# Patient Record
Sex: Male | Born: 1985
Health system: Southern US, Community
[De-identification: ages and names within clinical notes are randomized; demographics above are authoritative.]

## PROBLEM LIST (undated history)

## (undated) DIAGNOSIS — R569 Unspecified convulsions: Secondary | ICD-10-CM

## (undated) DIAGNOSIS — J40 Bronchitis, not specified as acute or chronic: Secondary | ICD-10-CM

## (undated) DIAGNOSIS — K200 Eosinophilic esophagitis: Secondary | ICD-10-CM

## (undated) DIAGNOSIS — G8929 Other chronic pain: Secondary | ICD-10-CM

## (undated) DIAGNOSIS — R0981 Nasal congestion: Secondary | ICD-10-CM

## (undated) DIAGNOSIS — T7840XA Allergy, unspecified, initial encounter: Secondary | ICD-10-CM

## (undated) DIAGNOSIS — M94261 Chondromalacia, right knee: Secondary | ICD-10-CM

## (undated) DIAGNOSIS — F419 Anxiety disorder, unspecified: Secondary | ICD-10-CM

## (undated) DIAGNOSIS — M545 Low back pain, unspecified: Secondary | ICD-10-CM

## (undated) DIAGNOSIS — I639 Cerebral infarction, unspecified: Secondary | ICD-10-CM

## (undated) DIAGNOSIS — J189 Pneumonia, unspecified organism: Secondary | ICD-10-CM

## (undated) DIAGNOSIS — F32A Depression, unspecified: Secondary | ICD-10-CM

## (undated) DIAGNOSIS — K602 Anal fissure, unspecified: Secondary | ICD-10-CM

## (undated) DIAGNOSIS — R12 Heartburn: Secondary | ICD-10-CM

## (undated) DIAGNOSIS — T304 Corrosion of unspecified body region, unspecified degree: Secondary | ICD-10-CM

## (undated) DIAGNOSIS — Z87898 Personal history of other specified conditions: Secondary | ICD-10-CM

## (undated) DIAGNOSIS — K589 Irritable bowel syndrome without diarrhea: Secondary | ICD-10-CM

## (undated) HISTORY — DX: Pneumonia, unspecified organism: J18.9

## (undated) HISTORY — DX: Allergy, unspecified, initial encounter: T78.40XA

## (undated) HISTORY — DX: Unspecified convulsions: R56.9

## (undated) HISTORY — DX: Eosinophilic esophagitis: K20.0

## (undated) HISTORY — PX: COLONOSCOPY: SHX174

## (undated) HISTORY — DX: Depression, unspecified: F32.A

## (undated) HISTORY — DX: Cerebral infarction, unspecified: I63.9

## (undated) HISTORY — PX: UPPER GASTROINTESTINAL ENDOSCOPY: SHX188

## (undated) HISTORY — DX: Anxiety disorder, unspecified: F41.9

## (undated) HISTORY — DX: Anal fissure, unspecified: K60.2

## (undated) HISTORY — DX: Corrosion of unspecified body region, unspecified degree: T30.4

---
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
## 2009-09-16 ENCOUNTER — Ambulatory Visit: Payer: Self-pay | Admitting: Diagnostic Radiology

## 2009-09-16 ENCOUNTER — Emergency Department (HOSPITAL_BASED_OUTPATIENT_CLINIC_OR_DEPARTMENT_OTHER): Admission: EM | Admit: 2009-09-16 | Discharge: 2009-09-17 | Payer: Self-pay | Admitting: Emergency Medicine

## 2010-02-01 ENCOUNTER — Ambulatory Visit
Admission: RE | Admit: 2010-02-01 | Discharge: 2010-02-01 | Payer: Self-pay | Source: Home / Self Care | Attending: Internal Medicine | Admitting: Internal Medicine

## 2010-02-01 ENCOUNTER — Other Ambulatory Visit: Payer: Self-pay | Admitting: Internal Medicine

## 2010-02-01 DIAGNOSIS — R030 Elevated blood-pressure reading, without diagnosis of hypertension: Secondary | ICD-10-CM | POA: Insufficient documentation

## 2010-02-01 DIAGNOSIS — K209 Esophagitis, unspecified without bleeding: Secondary | ICD-10-CM | POA: Insufficient documentation

## 2010-02-01 DIAGNOSIS — K922 Gastrointestinal hemorrhage, unspecified: Secondary | ICD-10-CM | POA: Insufficient documentation

## 2010-02-01 LAB — CBC WITH DIFFERENTIAL/PLATELET
Basophils Absolute: 0.1 10*3/uL (ref 0.0–0.1)
Basophils Relative: 1.4 % (ref 0.0–3.0)
Eosinophils Absolute: 0.2 10*3/uL (ref 0.0–0.7)
Eosinophils Relative: 3.4 % (ref 0.0–5.0)
HCT: 43.2 % (ref 39.0–52.0)
Hemoglobin: 14.9 g/dL (ref 13.0–17.0)
Lymphocytes Relative: 29.3 % (ref 12.0–46.0)
Lymphs Abs: 1.5 10*3/uL (ref 0.7–4.0)
MCHC: 34.5 g/dL (ref 30.0–36.0)
MCV: 89.6 fl (ref 78.0–100.0)
Monocytes Absolute: 0.4 10*3/uL (ref 0.1–1.0)
Monocytes Relative: 7.1 % (ref 3.0–12.0)
Neutro Abs: 3 10*3/uL (ref 1.4–7.7)
Neutrophils Relative %: 58.8 % (ref 43.0–77.0)
Platelets: 230 10*3/uL (ref 150.0–400.0)
RBC: 4.82 Mil/uL (ref 4.22–5.81)
RDW: 13 % (ref 11.5–14.6)
WBC: 5.1 10*3/uL (ref 4.5–10.5)

## 2010-02-01 LAB — HEPATIC FUNCTION PANEL
ALT: 29 U/L (ref 0–53)
AST: 24 U/L (ref 0–37)
Albumin: 4.5 g/dL (ref 3.5–5.2)
Alkaline Phosphatase: 59 U/L (ref 39–117)
Bilirubin, Direct: 0.2 mg/dL (ref 0.0–0.3)
Total Bilirubin: 1.2 mg/dL (ref 0.3–1.2)
Total Protein: 7.4 g/dL (ref 6.0–8.3)

## 2010-02-01 LAB — BASIC METABOLIC PANEL
BUN: 12 mg/dL (ref 6–23)
CO2: 30 mEq/L (ref 19–32)
Calcium: 9.7 mg/dL (ref 8.4–10.5)
Chloride: 103 mEq/L (ref 96–112)
Creatinine, Ser: 0.8 mg/dL (ref 0.4–1.5)
GFR: 137.75 mL/min (ref >60.00)
Glucose, Bld: 75 mg/dL (ref 70–99)
Potassium: 4.2 mEq/L (ref 3.5–5.1)
Sodium: 141 mEq/L (ref 135–145)

## 2010-02-01 LAB — IBC PANEL
Iron: 186 ug/dL — ABNORMAL HIGH (ref 42–165)
Saturation Ratios: 53.8 % — ABNORMAL HIGH (ref 20.0–50.0)
Transferrin: 247 mg/dL (ref 212.0–360.0)

## 2010-02-01 LAB — CHOLESTEROL, TOTAL: Cholesterol: 147 mg/dL (ref 0–200)

## 2010-02-01 LAB — TSH: TSH: 0.98 u[IU]/mL (ref 0.35–5.50)

## 2010-02-04 ENCOUNTER — Emergency Department (HOSPITAL_COMMUNITY)
Admission: EM | Admit: 2010-02-04 | Discharge: 2010-02-05 | Payer: Self-pay | Source: Home / Self Care | Admitting: Emergency Medicine

## 2010-02-06 ENCOUNTER — Telehealth: Payer: Self-pay | Admitting: Internal Medicine

## 2010-02-07 ENCOUNTER — Encounter: Payer: Self-pay | Admitting: Internal Medicine

## 2010-02-10 ENCOUNTER — Encounter: Payer: Self-pay | Admitting: Internal Medicine

## 2010-02-13 LAB — URINALYSIS, ROUTINE W REFLEX MICROSCOPIC
Bilirubin Urine: NEGATIVE
Hgb urine dipstick: NEGATIVE
Ketones, ur: NEGATIVE mg/dL
Nitrite: NEGATIVE
Protein, ur: NEGATIVE mg/dL
Specific Gravity, Urine: 1.01 (ref 1.005–1.030)
Urine Glucose, Fasting: NEGATIVE mg/dL
Urobilinogen, UA: 0.2 mg/dL (ref 0.0–1.0)
pH: 6 (ref 5.0–8.0)

## 2010-02-13 LAB — DIFFERENTIAL
Basophils Absolute: 0.1 10*3/uL (ref 0.0–0.1)
Basophils Relative: 1 % (ref 0–1)
Eosinophils Absolute: 0.2 10*3/uL (ref 0.0–0.7)
Eosinophils Relative: 2 % (ref 0–5)
Lymphocytes Relative: 15 % (ref 12–46)
Lymphs Abs: 1.5 10*3/uL (ref 0.7–4.0)
Monocytes Absolute: 0.6 10*3/uL (ref 0.1–1.0)
Monocytes Relative: 6 % (ref 3–12)
Neutro Abs: 7.8 10*3/uL — ABNORMAL HIGH (ref 1.7–7.7)
Neutrophils Relative %: 77 % (ref 43–77)

## 2010-02-13 LAB — POCT I-STAT, CHEM 8
BUN: 19 mg/dL (ref 6–23)
Calcium, Ion: 1.16 mmol/L (ref 1.12–1.32)
Chloride: 104 mEq/L (ref 96–112)
Creatinine, Ser: 1 mg/dL (ref 0.4–1.5)
Glucose, Bld: 98 mg/dL (ref 70–99)
HCT: 46 % (ref 39.0–52.0)
Hemoglobin: 15.6 g/dL (ref 13.0–17.0)
Potassium: 3.7 mEq/L (ref 3.5–5.1)
Sodium: 141 mEq/L (ref 135–145)
TCO2: 27 mmol/L (ref 0–100)

## 2010-02-13 LAB — CBC
HCT: 43.3 % (ref 39.0–52.0)
Hemoglobin: 15.2 g/dL (ref 13.0–17.0)
MCH: 30.6 pg (ref 26.0–34.0)
MCHC: 35.1 g/dL (ref 30.0–36.0)
MCV: 87.3 fL (ref 78.0–100.0)
Platelets: 218 10*3/uL (ref 150–400)
RBC: 4.96 MIL/uL (ref 4.22–5.81)
RDW: 12.4 % (ref 11.5–15.5)
WBC: 10.1 10*3/uL (ref 4.0–10.5)

## 2010-02-23 ENCOUNTER — Encounter: Payer: Self-pay | Admitting: Internal Medicine

## 2010-03-02 ENCOUNTER — Encounter: Payer: Self-pay | Admitting: Internal Medicine

## 2010-03-02 NOTE — Consult Note (Signed)
Summary: [facility]  [facility]   Imported By: Lanelle Bal 02/17/2010 08:37:25  _____________________________________________________________________  External Attachment:    Type:   Image     Comment:   External Document

## 2010-03-02 NOTE — Progress Notes (Signed)
Summary: [facility]  Phone Note Outgoing Call Call back at [phone]   Details for Reason: [facility] Triage Call Report Triage Record Num: [id number] Operator: Caswell Corwin Patient Name: Benjamin Reed Call Date & Time: 02/04/2010 4:01:12PM Patient Phone: [phone] PCP: Patient Gender: Male PCP Fax : Patient DOB: [birth year] Practice Name: [facility] Reason for Call: Pt states he was seen 02/01/10 for a physical and has internal hemorroids. has an appt set with gastroenterologist. Today can barely walk due to stomach and hemorrhoid pain. Hemorroids are protruding out and pain is making him nauseated. Abd pain started aboit 1300 and is constant, buring and stabbing. Has vomitined 2-3 x. Afebrile. Feels like his " heart is going to pound out of his chest for the past hr." Triaged rectal sx, abd pain and GI bldg. Pt having unbearable pain and feels dizzy even with laying down. Inst to go to E/R by 911. Protocol(s) Used: Abdominal Pain Protocol(s) Used: Gastrointestinal Bleeding Protocol(s) Used: Rectal Symptoms Recommended Outcome per Protocol: Activate EMS 911 Reason for Outcome: Associated with abdominal pain or discomfort GI bleeding, more than streaks or scant amount of blood or passing black tarry stool(s) Passing red, black or tarry material from rectum AND onset of new signs and symptoms of hypovolemia Care Advice:  ~ Do not give the patient anything to eat or drink.  ~ An adult should stay with the patient, preferably one trained in CPR. Lay the person down and elevate legs at least 12 inches (30 cm) above level of heart. Cover to help maintain body temperature.  ~ Write down provider's name. List or place the following in a bag for transport with the patient: current prescription and/or nonprescription medications; alternative treatments, therapies and medications; and street drugs.  ~ 01/ Summary of Call: I called to check on the patient and was made  aware that he is not there right now. Lm with man who answered to have the patient call our office. Lucious Groves CMA  February 06, 2010 9:07 AM   I spoke with the patient and he notes that he is still pain-- LLQ of the abd. Bright and dark red bleeding began yesterday. He notes that he will see GI tomorrow morning. Please advise. Initial call taken by: Lucious Groves CMA,  February 06, 2010 9:19 AM  Follow-up for Phone Call        keep GI appointment If fever, severe abdominal pain, feeling fainty or severe bleeding ----> go to the ER Follow-up by: [facility] E. Paz MD,  February 06, 2010 9:34 AM  Additional Follow-up for Phone Call Additional follow up Details #1::        Patient notified. Additional Follow-up by: Lucious Groves CMA,  February 06, 2010 10:07 AM

## 2010-03-02 NOTE — Procedures (Signed)
Summary: Colonoscopy -- neg except for hemorrhoids  Colonoscopy Report/[facility]   Imported By: Maryln Gottron 02/22/2010 15:47:16  _____________________________________________________________________  External Attachment:    Type:   Image     Comment:   External Document

## 2010-03-02 NOTE — Assessment & Plan Note (Signed)
Summary: NEW TO EST//PH   Vital Signs:  Patient profile:   25 year old male Height:      70 inches (177.80 cm) Weight:      139 pounds (63.18 kg) BMI:     20.02 Temp:     98.4 degrees F (36.89 degrees C) oral Pulse rate:   70 / minute BP sitting:   140 / 50  (left arm)  Vitals Entered By: Lucious Groves CMA (February 01, 2010 1:03 PM) CC: NP est care/CPX./kb Is Patient Diabetic? No Pain Assessment Patient in pain? no      Comments Patient notes that he would like referral due to history of trouble with hemorrhoids.   History of Present Illness: CPX here w/ mom     Preventive Screening-Counseling & Management  Alcohol-Tobacco     Smoking Status: current      Drug Use:  no.    Current Medications (verified): 1)  None  Allergies (verified): No Known Drug Allergies  Past History:  Past Medical History: Febrile SZ a s a child blood in stools: Cscope 2009---> internal hemorrhoids  eosinophilic esophagitis per patient  (EGD 9-09) elevated BP? h/o anxiety when he was in the [employer], was Rx zoloft   Past Surgical History: no major   Family History: Cholesterol-- M, GPs HTN-- M , GPs MI-- no colon ca-- GGM dx in her 31 to 79s  colon polyps--  GF dx in his late 25s , aunt  prostate ca-- PGF   Social History: was in the [employer] Occupation: International aid/development worker  Married, lives in his parents house  wife pregnant  Current Smoker-- 4 cigs per day since age 40 and uses dip, once can every 3-4 days Alcohol use-yes--once a week Drug use-no Occupation:  employed Smoking Status:  current Drug Use:  no  Review of Systems General:  Denies fatigue and fever; wst varies . CV:  Denies swelling of feet; occasionally CP. Resp:  Denies cough. GI:  occasionally nausea, occasionally diarrhea  + abdominal pain, L>R x years (2009), worse x 7 months  has almost daily red blood per rectum, sometimes abundant. This is going on for years. He has occasional odynophagia, frequent  dysphagia.. GU:  Denies dysuria and hematuria. Psych:  Denies anxiety and depression.  Physical Exam  General:  alert, well-developed, and well-nourished.   Ears:  no pale or jaundice Neck:  no masses and no thyromegaly.   Lungs:  normal respiratory effort, no intercostal retractions, no accessory muscle use, and normal breath sounds.   Heart:  normal rate, regular rhythm, no gallop, and no rub.  very mild systolic murmur?  Abdomen:  soft, normal bowel sounds, no distention, no masses, no guarding, and no rigidity.  mild diffuse tenderness. Rectal:  No external abnormalities noted. Normal sphincter tone. No rectal masses or tenderness on DRE ANOSCOPY: Multiple small internal hemorrhoids with no signs of active or recent bleed Prostate:  Prostate gland firm and smooth, no enlargement, nodularity, tenderness, mass, asymmetry or induration.   Impression & Recommendations:  Problem # 1:  ROUTINE GENERAL MEDICAL EXAM@HEALTH  CARE FACL (ICD-V70.0) Td-- 2008 per patient  recommend a flu shot if so desired We discussed diet and exercise STE info provided  Labs  Orders: Venipuncture (04540) TLB-Cholesterol, Total (82465-CHO) TLB-BMP (Basic Metabolic Panel-BMET) (80048-METABOL) TLB-CBC Platelet - w/Differential (85025-CBCD) TLB-TSH (Thyroid Stimulating Hormone) (84443-TSH) TLB-Hepatic/Liver Function Pnl (80076-HEPATIC) Specimen Handling (98119)  Problem # 2:  GI BLEEDING (ICD-578.9) persistent red blood per rectum. Apparently he had  a colonoscopy in 2009 while  he was in the [employer] and he was told he has internal hemorrhoids. He also has intermittent diarrhea and ill-defined abdominal pain   plan: Refer to GI, colonoscopy?. bleeding may be from hemorrhoids but given the other symptoms, other etiologies are possible  Orders: TLB-IBC Pnl (Iron/FE;Transferrin) (83550-IBC) Specimen Handling (81191) Gastroenterology Referral (GI)  Problem # 3:  ESOPHAGITIS (ICD-530.10)  history of  "eosinophilic esophagitis" per patient. reports dysphagia and sometimes odynophagia to GI prilosec   Orders: Gastroenterology Referral (GI)  Problem # 4:  ELEVATED BLOOD PRESSURE WITHOUT DIAGNOSIS OF HYPERTENSION (ICD-796.2) elevated BP see instructions   Patient Instructions: 1)  Check your blood pressure 2 or 3 times a week. If it is more than 140/85 consistently,please let us know  2)  prilosec 20mg  OTC 1 a day ona empty stomach 3)  came back in 6 months    Orders Added: 1)  Venipuncture [36415] 2)  TLB-Cholesterol, Total [82465-CHO] 3)  TLB-BMP (Basic Metabolic Panel-BMET) [80048-METABOL] 4)  TLB-CBC Platelet - w/Differential [85025-CBCD] 5)  TLB-TSH (Thyroid Stimulating Hormone) [84443-TSH] 6)  TLB-Hepatic/Liver Function Pnl [80076-HEPATIC] 7)  TLB-IBC Pnl (Iron/FE;Transferrin) [83550-IBC] 8)  Specimen Handling [99000] 9)  Gastroenterology Referral [GI] 10)  New Patient 18-39 years [99385]

## 2010-03-20 ENCOUNTER — Encounter: Payer: Self-pay | Admitting: Internal Medicine

## 2010-03-21 ENCOUNTER — Ambulatory Visit (INDEPENDENT_AMBULATORY_CARE_PROVIDER_SITE_OTHER): Payer: Self-pay | Admitting: Internal Medicine

## 2010-03-21 ENCOUNTER — Encounter: Payer: Self-pay | Admitting: Internal Medicine

## 2010-03-21 ENCOUNTER — Other Ambulatory Visit: Payer: Self-pay | Admitting: Internal Medicine

## 2010-03-21 ENCOUNTER — Ambulatory Visit (INDEPENDENT_AMBULATORY_CARE_PROVIDER_SITE_OTHER)
Admission: RE | Admit: 2010-03-21 | Discharge: 2010-03-21 | Disposition: A | Discharge status: Home / Self Care | Payer: PRIVATE HEALTH INSURANCE | Source: Ambulatory Visit | Attending: Internal Medicine | Admitting: Internal Medicine

## 2010-03-21 DIAGNOSIS — R079 Chest pain, unspecified: Secondary | ICD-10-CM

## 2010-03-21 DIAGNOSIS — M542 Cervicalgia: Secondary | ICD-10-CM

## 2010-03-21 LAB — CONVERTED CEMR LAB
BUN: 9 mg/dL (ref 6–23)
Basophils Absolute: 0 10*3/uL (ref 0.0–0.1)
Basophils Relative: 1 % (ref 0–1)
CO2: 27 meq/L (ref 19–32)
Calcium: 10.1 mg/dL (ref 8.4–10.5)
Chloride: 101 meq/L (ref 96–112)
Creatinine, Ser: 0.96 mg/dL (ref 0.40–1.50)
Eosinophils Absolute: 0.1 10*3/uL (ref 0.0–0.7)
Eosinophils Relative: 2 % (ref 0–5)
Glucose, Bld: 104 mg/dL — ABNORMAL HIGH (ref 70–99)
HCT: 42 % (ref 39.0–52.0)
Hemoglobin: 14.9 g/dL (ref 13.0–17.0)
Lymphocytes Relative: 30 % (ref 12–46)
Lymphs Abs: 1.7 10*3/uL (ref 0.7–4.0)
MCHC: 35.5 g/dL (ref 30.0–36.0)
MCV: 86.1 fL (ref 78.0–100.0)
Monocytes Absolute: 0.4 10*3/uL (ref 0.1–1.0)
Monocytes Relative: 8 % (ref 3–12)
Neutro Abs: 3.4 10*3/uL (ref 1.7–7.7)
Neutrophils Relative %: 60 % (ref 43–77)
Platelets: 243 10*3/uL (ref 150–400)
Potassium: 4 meq/L (ref 3.5–5.3)
RBC: 4.88 M/uL (ref 4.22–5.81)
RDW: 12.6 % (ref 11.5–15.5)
Sed Rate: 1 mm/hr (ref 0–16)
Sodium: 139 meq/L (ref 135–145)
Total CK: 94 units/L (ref 7–232)
WBC: 5.6 10*3/uL (ref 4.0–10.5)

## 2010-03-22 ENCOUNTER — Telehealth: Payer: Self-pay | Admitting: Internal Medicine

## 2010-03-22 NOTE — Consult Note (Signed)
Summary: anal fissure, next Cscope at age 25----GI   [facility]   Imported By: Maryln Gottron 03/16/2010 10:21:31  _____________________________________________________________________  External Attachment:    Type:   Image     Comment:   External Document

## 2010-03-22 NOTE — Letter (Signed)
Summary: Rx hemorrhoidectomy--- Surgery  [facility]   Imported By: Maryln Gottron 03/16/2010 10:18:40  _____________________________________________________________________  External Attachment:    Type:   Image     Comment:   External Document

## 2010-03-27 ENCOUNTER — Telehealth: Payer: Self-pay | Admitting: Internal Medicine

## 2010-03-28 NOTE — Assessment & Plan Note (Signed)
Summary: sore throat and gland, neck, little chest pain--ph   Vital Signs:  Patient profile:   25 year old male Weight:      138.50 pounds O2 Sat:      100 % on Room air Temp:     98.8 degrees F oral Pulse rate:   84 / minute Pulse rhythm:   regular BP sitting:   118 / 70  (left arm) Cuff size:   regular  Vitals Entered By: Army Fossa CMA (March 21, 2010 2:57 PM)  O2 Flow:  Room air CC: Started sunday, pain in neck cannot turn head. SOB- has an inhaler unsure of name Comments [facility] [address]    History of Present Illness: c/o neck pain, SOB and CP symptoms started 2 days ago. neck pain is located along the L SCM, pain is steady, increase to touch or  if he swallows SOB  at rest, worse w/ talking or exertion CP is described as soreness to the ant L chest  ROS No fever no RN, some ST, no drooling no abd pain, no GERD symptoms  no cough except when uses a inhaler prescribed by GI no LE edema or pain or recent airplane trip  no injury or fall  Current Medications (verified): 1)  Lidocaine Hcl 0.5 % Soln (Lidocaine Hcl) .... For Hemorrhoids  Allergies (verified): 1)  ! Keflex  Past History:  Past Medical History: Reviewed history from 02/01/2010 and no changes required. Febrile SZ a s a child blood in stools: Cscope 2009---> internal hemorrhoids  eosinophilic esophagitis per patient  (EGD 9-09) elevated BP? h/o anxiety when he was in the [employer], was Rx zoloft   Past Surgical History: Reviewed history from 02/01/2010 and no changes required. no major   Social History: Reviewed history from 02/01/2010 and no changes required. was in the [employer] Occupation: International aid/development worker  Married, lives in his parents house  wife pregnant  Current Smoker-- 4 cigs per day since age 27 and uses dip, once can every 3-4 days Alcohol use-yes--once a week Drug use-no  Physical Exam  General:  alert and well-developed.  NAD, o2 sat 100% on RA  Head:  fmetric  Ears:  R ear normal  and L ear normal.  R ear normal and L ear normal.    Mouth:  nred or d/c, throat is symetric, no drooling  Neck:  FROM, no deformities, redness , rash ; quite tender along the L SCM w/o fluctuancy. no thyromegaly. Supraclavicular fossa free of mass, LAD or crepitus . Normal carotid pulse  Chest Wall:  slightly  tender at the L side, no rash  Lungs:  normal respiratory effort, no intercostal retractions, no accessory muscle use, and normal breath sounds.  no increased WOB Heart:  normal rate, regular rhythm, no gallop, and no rub.    Abdomen:  soft, non-tender, and no distention.     Impression & Recommendations:  Problem # 1:  CHEST PAIN (ICD-786.50) chest and anterior neck pain  etiology unclear EKG-- no pericarditic changes despite SOB he is no distress and O2sat normal plan: CBC BMP SED rate CK  stat d dimer, ER if elevated  if w/u negati\ve and symptoms persist, consider CT neck or chest see instructions  pt's cell -- [id number]  Orders: EKG w/ Interpretation (93000) T-D-Dimer Fibrin Derivatives Quantitive (09811-91478) T-2 View CXR (71020TC) T-Cervical Spine Comp 4 Views (72050TC) Specimen Handling (29562)  Problem # 2:  NECK PAIN (ICD-723.1)  see #1 Orders: EKG w/ Interpretation (  93000) T-D-Dimer Fibrin Derivatives Quantitive ([phone]) T-2 View CXR (71020TC) T-Cervical Spine Comp 4 Views (72050TC) Specimen Handling (62130)  His updated medication list for this problem includes:    Naproxen 500 Mg Tabs (Naproxen) .Marland Kitchen... 1 by mouth two times a day as needed pain    Flexeril 10 Mg Tabs (Cyclobenzaprine hcl) .Marland Kitchen... 1 by mouth two times a day.  Complete Medication List: 1)  Lidocaine Hcl 0.5 % Soln (Lidocaine hcl) .... For hemorrhoids 2)  Naproxen 500 Mg Tabs (Naproxen) .Marland Kitchen.. 1 by mouth two times a day as needed pain 3)  Flexeril 10 Mg Tabs (Cyclobenzaprine hcl) .Marland Kitchen.. 1 by mouth two times a day.  Patient Instructions: 1)  motrin 200mg  2 by mouth every 6 hours as  needed 2)  ER if symptoms severe or getting worse  3)  get XRs 4)  call in AM, let us know how you are doing    Orders Added: 1)  EKG w/ Interpretation [93000] 2)  T-D-Dimer Fibrin Derivatives Quantitive [[id number]] 3)  T-2 View CXR [71020TC] 4)  T-Cervical Spine Comp 4 Views [72050TC] 5)  Specimen Handling [99000] 6)  Est. Patient Level IV [95284]

## 2010-03-28 NOTE — Progress Notes (Signed)
Summary: Labs/Update  Phone Note Call from Patient Call back at Home Phone [phone]   Summary of Call: Patient was made aware of XR results and needs lab results. He also states that he is not better at all. He took Tylenol last night and it did not help at all. Please advise. Initial call taken by: Lucious Groves CMA,  March 22, 2010 11:07 AM  Follow-up for Phone Call        advise patient: all labs, XRs neg. call in naproxen 500 1 by mouth two times a day as needed pain #40, no RF, watch fopr GI S/E Flexeril 10 i by mouth two times a day #21, no  RF, will cause drowsiness ER if symptoms severe call if no better in 2 -3 days  Follow-up by: Jose E. Paz MD,  March 22, 2010 12:52 PM  Additional Follow-up for Phone Call Additional follow up Details #1::        I spoke w/ pt he is aware of all the above instructions. Army Fossa CMA  March 22, 2010 1:23 PM     New/Updated Medications: NAPROXEN 500 MG TABS (NAPROXEN) 1 by mouth two times a day as needed pain FLEXERIL 10 MG TABS (CYCLOBENZAPRINE HCL) 1 by mouth two times a day. Prescriptions: FLEXERIL 10 MG TABS (CYCLOBENZAPRINE HCL) 1 by mouth two times a day.  #21 x 0   Entered by:   Army Fossa CMA   Authorized by:   Nolon Rod. Paz MD   Signed by:   Army Fossa CMA on 03/22/2010   Method used:   Electronically to        [id number] (ret[facility])       [address]       [place], [place]  [address]       Ph: [phone] or [phone]       Fax: [phone]   RxID:   [id number] NAPROXEN 500 MG TABS (NAPROXEN) 1 by mouth two times a day as needed pain  #40 x 0   Entered by:   Army Fossa CMA   Authorized by:   Nolon Rod. Paz MD   Signed by:   Army Fossa CMA on 03/22/2010   Method used:   Electronically to        [id number] (retail)       [address]       [place], [place]  [address]       Ph: [phone] or [phone]       Fax: [phone]   RxID:   [id number]   Appended  Document: Labs/Update please call him back, he is supposed to takenaproxen  and discontinue Motrin which was recommended at the time of the visit.  Appended Document: Labs/Update   left message for pt to call back. Army Fossa CMA  March 23, 2010 10:06 AM  i spoke w/ pt he is aware. Army Fossa CMA  March 23, 2010 10:12 AM \

## 2010-04-05 ENCOUNTER — Other Ambulatory Visit: Payer: Self-pay | Admitting: General Surgery

## 2010-04-05 ENCOUNTER — Encounter (HOSPITAL_COMMUNITY): Payer: PRIVATE HEALTH INSURANCE | Attending: General Surgery

## 2010-04-05 DIAGNOSIS — Z01812 Encounter for preprocedural laboratory examination: Secondary | ICD-10-CM | POA: Insufficient documentation

## 2010-04-05 LAB — BASIC METABOLIC PANEL
BUN: 17 mg/dL (ref 6–23)
CO2: 32 mEq/L (ref 19–32)
Calcium: 9.6 mg/dL (ref 8.4–10.5)
Chloride: 103 mEq/L (ref 96–112)
Creatinine, Ser: 0.86 mg/dL (ref 0.4–1.5)
GFR calc Af Amer: >60 mL/min (ref >60)
GFR calc non Af Amer: >60 mL/min (ref >60)
Glucose, Bld: 67 mg/dL — ABNORMAL LOW (ref 70–99)
Potassium: 3.5 mEq/L (ref 3.5–5.1)
Sodium: 142 mEq/L (ref 135–145)

## 2010-04-05 LAB — CBC
HCT: 44 % (ref 39.0–52.0)
Hemoglobin: 15.1 g/dL (ref 13.0–17.0)
MCH: 30.1 pg (ref 26.0–34.0)
MCHC: 34.3 g/dL (ref 30.0–36.0)
MCV: 87.8 fL (ref 78.0–100.0)
Platelets: 221 10*3/uL (ref 150–400)
RBC: 5.01 MIL/uL (ref 4.22–5.81)
RDW: 12.3 % (ref 11.5–15.5)
WBC: 7 10*3/uL (ref 4.0–10.5)

## 2010-04-05 LAB — SURGICAL PCR SCREEN
MRSA, PCR: NEGATIVE
Staphylococcus aureus: POSITIVE — AB

## 2010-04-06 NOTE — Progress Notes (Signed)
Summary: pt status  ---- Converted from flag ---- ---- 03/22/2010 12:52 PM, Duane Trias E. Maxemiliano Riel MD wrote: check on him ------------------------------  I spoke w/ pt he states that he is starting to feel better. Army Fossa CMA  March 27, 2010 2:11 PM

## 2010-04-11 NOTE — Letter (Signed)
Summary: [facility]   Imported By: Maryln Gottron 04/04/2010 14:57:29  _____________________________________________________________________  External Attachment:    Type:   Image     Comment:   External Document

## 2010-04-12 ENCOUNTER — Ambulatory Visit (HOSPITAL_COMMUNITY)
Admission: RE | Admit: 2010-04-12 | Discharge: 2010-04-13 | Disposition: A | Discharge status: Home / Self Care | Payer: 59 | Source: Ambulatory Visit | Attending: General Surgery | Admitting: General Surgery

## 2010-04-12 ENCOUNTER — Other Ambulatory Visit: Payer: Self-pay | Admitting: General Surgery

## 2010-04-12 DIAGNOSIS — R112 Nausea with vomiting, unspecified: Secondary | ICD-10-CM | POA: Insufficient documentation

## 2010-04-12 DIAGNOSIS — K648 Other hemorrhoids: Secondary | ICD-10-CM | POA: Insufficient documentation

## 2010-04-12 HISTORY — PX: HEMORRHOID SURGERY: SHX153

## 2010-04-12 LAB — GLUCOSE, CAPILLARY: Glucose-Capillary: 121 mg/dL — ABNORMAL HIGH (ref 70–99)

## 2010-04-17 NOTE — Op Note (Signed)
NAMEBAPTISTE, LITTLER                ACCOUNT NO.:  [id number]  MEDICAL RECORD NO.:  [id number]           PATIENT TYPE:  O  LOCATION:  DAYL                         FACILITY:  [facility]  PHYSICIAN:  Sharlet Salina T. Ammarie Matsuura, M.D.DATE OF BIRTH:  Feb 09, 1985  DATE OF PROCEDURE:  04/12/2010 DATE OF DISCHARGE:                              OPERATIVE REPORT   PREOPERATIVE DIAGNOSIS:  Internal hemorrhoids with prolapse and bleeding.  POSTOPERATIVE DIAGNOSIS:  Internal hemorrhoids with prolapse and bleeding.  SURGICAL PROCEDURES:  Procedure for prolapse and hemorrhoids.  SURGEON:  Lorne Skeens. Nicolina Hirt, M.D.  ANESTHESIA:  General.  BRIEF HISTORY:  Benjamin Reed is 25 year old male who presents with persistently bleeding grade 4 prolapsing internal hemorrhoids confirmed on exam.  It did responded to conservative treatment and after discussion preoperatively, we elected to proceed with PPH for treatment of his bleeding, prolapsed internal hemorrhoids.  We discussed the nature of the procedure, its indications, risks of general anesthesia as well as bleeding, infection, chronic pain, recurrent symptoms.  He is now brought to operating room for this procedure.  DESCRIPTION OF PROCEDURE:  The patient had undergone a rectal prep preoperatively.  He was brought to the operating room and general endotracheal anesthesia was induced in the stretcher and he was carefully rolled into padded prone jack-knife position with buttocks taped apart.  The rectum and perianal area were sterilely prepped and draped.  He received preoperative antibiotics and correct patient and procedure were verified.  A 7 cc of Marcaine with 3 cc of Wydase was injected into the hemorrhoidal groups.  Following that, a perirectal block with 30 cc of Marcaine was performed.  The anus was then gently dilated to 4 fingers.  The large bullet retractor was placed.  There was no evidence of fissure or other pathology other than fairly  extensive somewhat friable internal hemorrhoids.  With the large bullet retractor placed carefully measuring at least 5 cm from the dentate line, a pursestring suture of 2-0 Prolene was placed circumferentially incorporating mucosa and submucosa.  The retractor was removed and the pursestring suture palpated and was intact and symmetrical.  The suture was brought through the [facility] retractor which was introduced.  The opened stapler was then inserted with the anvil through the pursestring suture which was secured and tied around the post.  The stapler was then advanced into the anus as it was closed and was seen to advance 5 cm around the dentate line.  It was left closed for 60 seconds for hemostasis and then fired, opened 3-quarters of a turn and easily removed.  The staple line was inspected and was symmetric and intact circumferentially without bleeding and was seen to be at least 4 cm to 5 cm above the dentate line.  The excised tissue was examined which was a nice circumferential 1.5 cm to 2 cm cuff of mucosa and submucosa with no muscle.  The staple line was then carefully inspected and there was no bleeding.  A Gelfoam and Marcaine ointment pack was placed.  The patient was taken to the recovery room in good condition.     Lorne Skeens. Kamon Fahr, M.D.  BTH/MEDQ  D:  04/12/2010  T:  04/12/2010  Job:  [id number]  Electronically Signed by Glenna Fellows M.D. on 04/17/2010 10:24:54 AM

## 2010-07-28 ENCOUNTER — Encounter: Payer: Self-pay | Admitting: Internal Medicine

## 2010-07-31 ENCOUNTER — Ambulatory Visit: Payer: Self-pay | Admitting: Internal Medicine

## 2011-01-30 HISTORY — PX: KNEE ARTHROSCOPY: SUR90

## 2011-05-16 ENCOUNTER — Ambulatory Visit (HOSPITAL_COMMUNITY)
Admission: RE | Admit: 2011-05-16 | Discharge: 2011-05-16 | Disposition: A | Discharge status: Home / Self Care | Payer: BC Managed Care – PPO | Source: Ambulatory Visit | Attending: Orthopedic Surgery | Admitting: Orthopedic Surgery

## 2011-05-16 DIAGNOSIS — M79606 Pain in leg, unspecified: Secondary | ICD-10-CM

## 2011-05-16 DIAGNOSIS — M79609 Pain in unspecified limb: Secondary | ICD-10-CM

## 2011-05-16 DIAGNOSIS — M7989 Other specified soft tissue disorders: Secondary | ICD-10-CM | POA: Insufficient documentation

## 2011-05-16 NOTE — Progress Notes (Signed)
*  PRELIMINARY RESULTS* Vascular Ultrasound Left lower extremity venous duplex has been completed.  Preliminary findings: Left= No evidence of DVT or baker's cyst.  Farrel Demark, RDMS 05/16/2011, 3:11 PM

## 2012-11-18 ENCOUNTER — Other Ambulatory Visit (HOSPITAL_COMMUNITY): Payer: Self-pay | Admitting: Orthopaedic Surgery

## 2012-11-18 DIAGNOSIS — S83206S Unspecified tear of unspecified meniscus, current injury, right knee, sequela: Secondary | ICD-10-CM

## 2012-11-24 ENCOUNTER — Ambulatory Visit (HOSPITAL_COMMUNITY)
Admission: RE | Admit: 2012-11-24 | Discharge: 2012-11-24 | Disposition: A | Discharge status: Home / Self Care | Payer: BC Managed Care – PPO | Source: Ambulatory Visit | Attending: Orthopaedic Surgery | Admitting: Orthopaedic Surgery

## 2012-11-24 DIAGNOSIS — M25569 Pain in unspecified knee: Secondary | ICD-10-CM | POA: Insufficient documentation

## 2012-11-24 DIAGNOSIS — Y9367 Activity, basketball: Secondary | ICD-10-CM | POA: Insufficient documentation

## 2012-11-24 DIAGNOSIS — X58XXXA Exposure to other specified factors, initial encounter: Secondary | ICD-10-CM | POA: Insufficient documentation

## 2012-11-24 DIAGNOSIS — S83206S Unspecified tear of unspecified meniscus, current injury, right knee, sequela: Secondary | ICD-10-CM

## 2012-11-24 DIAGNOSIS — M25469 Effusion, unspecified knee: Secondary | ICD-10-CM | POA: Insufficient documentation

## 2012-11-24 DIAGNOSIS — S8990XA Unspecified injury of unspecified lower leg, initial encounter: Secondary | ICD-10-CM | POA: Insufficient documentation

## 2012-11-24 DIAGNOSIS — R609 Edema, unspecified: Secondary | ICD-10-CM | POA: Insufficient documentation

## 2012-11-29 DIAGNOSIS — M94261 Chondromalacia, right knee: Secondary | ICD-10-CM

## 2012-11-29 HISTORY — DX: Chondromalacia, right knee: M94.261

## 2012-12-02 ENCOUNTER — Other Ambulatory Visit: Payer: Self-pay | Admitting: Orthopaedic Surgery

## 2012-12-09 ENCOUNTER — Encounter (HOSPITAL_BASED_OUTPATIENT_CLINIC_OR_DEPARTMENT_OTHER): Payer: Self-pay | Admitting: *Deleted

## 2012-12-09 DIAGNOSIS — R0981 Nasal congestion: Secondary | ICD-10-CM

## 2012-12-09 HISTORY — DX: Nasal congestion: R09.81

## 2012-12-11 NOTE — H&P (Signed)
Benjamin Reed is an 27 y.o. male.   Chief Complaint: Right knee pain HPI: Benjamin Reed has had continued knee pain, right side for a number of weeks.  He is having pain when he ambulates now trouble sleeping at nighttime.  It is limited his activities of daily living.  He localizes pain along the medial area of the knee.  He has had one injection in September which only helped him temporarily.  Recent MRI scan from 11/24/12.  is essentially normal but on his exam he does have a palpable painful plica.We have discussed proceeding with a arthroscopy of the right knee to excise the plica and eliminate his pain and restore his normal function.  Past Medical History  Diagnosis Date  . IBS (irritable bowel syndrome)     no current meds.  . Chronic lower back pain     history of radiofrequency ablation  . Eosinophilic esophagitis   . 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 History of febrile seizure     as a child  . Nasal congestion 12/09/2012    Past Surgical History  Procedure Laterality Date  . Hemorrhoid surgery  04/12/2010    procedure for prolapse and hemorrhoids  . Knee arthroscopy Left 2013    Family History  Problem Relation Age of Onset  . Hyperlipidemia Mother     GPs  . Hypertension Mother     GPs  . Prostate cancer Paternal Grandfather   . Colon cancer      GGM dx in her 74s to 71s  . Colon polyps      GF dx in his late 19s, aunt   Social History:  reports that he quit smoking about 4 weeks ago. He uses smokeless tobacco. He reports that he drinks alcohol. He reports that he does not use illicit drugs.  Allergies:  Allergies  Allergen Reactions  . Zoloft [Sertraline Hcl] Other (See Comments)    CHEST PAIN, SYNCOPE  . Cephalexin Hives  . Other Other (See Comments)    MREs - ESOPHAGEAL EROSION    No prescriptions prior to admission    No results found for this or any previous visit (from the past 48 hour(s)). No results found.  Review of Systems  All other systems  reviewed and are negative.    Height 5\' 10"  (1.778 m), weight 62.596 kg (138 lb). Physical Exam  Constitutional: He is oriented to person, place, and time. He appears well-nourished.  HENT:  Head: Atraumatic.  Eyes: EOM are normal.  Neck: Neck supple.  Cardiovascular: Normal rate.   Respiratory: Effort normal.  GI: Soft.  Musculoskeletal:  Right knee exam: No effusion.  Pain along the medial patellar facet and lateral joint line to palpation.  He does have a pop with full extension.  Good ligamentous stability.  There is a palpable plica which is painful to pressure.  Neurological: He is oriented to person, place, and time.  Skin: Skin is warm.  Psychiatric: He has a normal mood and affect.     Assessment/Plan Assessment: Right knee plica. Plan: Benjamin Reed has undergone one injection in his knee with minimal benefit but is having continued pain limiting his activities and trouble sleeping at nighttime.  We have discussed proceeding with a knee arthroscopy to excise the plica and hopefully help him with his discomfort.  We have discussed the risks of anesthesia, infection and DVT associated with this type procedure.  Also the need for therapy to optimize results discussed with him as well.  Benjamin Reed R 12/11/2012, 5:21 PM

## 2012-12-11 NOTE — Pre-Procedure Instructions (Signed)
Pt. has copies of medical records, will bring 12/12/2012

## 2012-12-12 NOTE — Pre-Procedure Instructions (Signed)
Pt. brought medical records; visit to [facility] from 09/25/2007 reviewed by Dr. Ivin Booty; pt. OK to come for surgery.

## 2012-12-16 ENCOUNTER — Encounter (HOSPITAL_BASED_OUTPATIENT_CLINIC_OR_DEPARTMENT_OTHER): Payer: Self-pay | Admitting: *Deleted

## 2012-12-16 ENCOUNTER — Encounter (HOSPITAL_BASED_OUTPATIENT_CLINIC_OR_DEPARTMENT_OTHER): Payer: BC Managed Care – PPO | Admitting: *Deleted

## 2012-12-16 ENCOUNTER — Encounter (HOSPITAL_BASED_OUTPATIENT_CLINIC_OR_DEPARTMENT_OTHER)
Admission: RE | Disposition: A | Discharge status: Home / Self Care | Payer: Self-pay | Source: Ambulatory Visit | Attending: Orthopaedic Surgery

## 2012-12-16 ENCOUNTER — Ambulatory Visit (HOSPITAL_BASED_OUTPATIENT_CLINIC_OR_DEPARTMENT_OTHER)
Admission: RE | Admit: 2012-12-16 | Discharge: 2012-12-16 | Disposition: A | Discharge status: Home / Self Care | Payer: BC Managed Care – PPO | Source: Ambulatory Visit | Attending: Orthopaedic Surgery | Admitting: Orthopaedic Surgery

## 2012-12-16 ENCOUNTER — Ambulatory Visit (HOSPITAL_BASED_OUTPATIENT_CLINIC_OR_DEPARTMENT_OTHER): Payer: BC Managed Care – PPO | Admitting: *Deleted

## 2012-12-16 DIAGNOSIS — M6750 Plica syndrome, unspecified knee: Secondary | ICD-10-CM | POA: Insufficient documentation

## 2012-12-16 DIAGNOSIS — Z9889 Other specified postprocedural states: Secondary | ICD-10-CM

## 2012-12-16 DIAGNOSIS — Z87891 Personal history of nicotine dependence: Secondary | ICD-10-CM | POA: Insufficient documentation

## 2012-12-16 HISTORY — DX: Low back pain: M54.5

## 2012-12-16 HISTORY — DX: Personal history of other specified conditions: Z87.898

## 2012-12-16 HISTORY — DX: Nasal congestion: R09.81

## 2012-12-16 HISTORY — DX: Other chronic pain: G89.29

## 2012-12-16 HISTORY — DX: Irritable bowel syndrome, unspecified: K58.9

## 2012-12-16 HISTORY — DX: Low back pain, unspecified: M54.50

## 2012-12-16 HISTORY — DX: Chondromalacia, right knee: M94.261

## 2012-12-16 HISTORY — PX: KNEE ARTHROSCOPY: SHX127

## 2012-12-16 LAB — POCT HEMOGLOBIN-HEMACUE: Hemoglobin: 14.1 g/dL (ref 13.0–17.0)

## 2012-12-16 SURGERY — ARTHROSCOPY, KNEE
Anesthesia: General | Site: Knee | Laterality: Right | Wound class: Clean

## 2012-12-16 MED ORDER — METHYLPREDNISOLONE ACETATE 40 MG/ML IJ SUSP
INTRAMUSCULAR | Status: AC
  Filled 2012-12-16: qty 1

## 2012-12-16 MED ORDER — LACTATED RINGERS IV SOLN
INTRAVENOUS | Status: DC
  Administered 2012-12-16: 07:00:00 via INTRAVENOUS

## 2012-12-16 MED ORDER — MORPHINE SULFATE 4 MG/ML IJ SOLN
INTRAMUSCULAR | Status: DC | PRN
  Administered 2012-12-16: 4 mg

## 2012-12-16 MED ORDER — BUPIVACAINE HCL (PF) 0.5 % IJ SOLN
INTRAMUSCULAR | Status: AC
  Filled 2012-12-16: qty 30

## 2012-12-16 MED ORDER — ONDANSETRON HCL 4 MG/2ML IJ SOLN
INTRAMUSCULAR | Status: DC | PRN
  Administered 2012-12-16: 4 mg via INTRAVENOUS

## 2012-12-16 MED ORDER — MIDAZOLAM HCL 5 MG/5ML IJ SOLN
INTRAMUSCULAR | Status: DC | PRN
  Administered 2012-12-16: 1 mg via INTRAVENOUS

## 2012-12-16 MED ORDER — HYDROMORPHONE HCL PF 1 MG/ML IJ SOLN
0.2500 mg | INTRAMUSCULAR | Status: DC | PRN
  Administered 2012-12-16 (×4): 0.5 mg via INTRAVENOUS

## 2012-12-16 MED ORDER — HYDROCODONE-ACETAMINOPHEN 5-325 MG PO TABS: 2.0000 | ORAL_TABLET | Freq: Four times a day (QID) | ORAL | Status: DC | PRN

## 2012-12-16 MED ORDER — MIDAZOLAM HCL 2 MG/2ML IJ SOLN: 1.0000 mg | INTRAMUSCULAR | Status: DC | PRN

## 2012-12-16 MED ORDER — OXYCODONE HCL 5 MG PO TABS
ORAL_TABLET | ORAL | Status: AC
  Filled 2012-12-16: qty 1

## 2012-12-16 MED ORDER — FENTANYL CITRATE 0.05 MG/ML IJ SOLN: 50.0000 ug | INTRAMUSCULAR | Status: DC | PRN

## 2012-12-16 MED ORDER — LACTATED RINGERS IV SOLN: INTRAVENOUS | Status: DC

## 2012-12-16 MED ORDER — BUPIVACAINE-EPINEPHRINE 0.5% -1:200000 IJ SOLN
INTRAMUSCULAR | Status: DC | PRN
  Administered 2012-12-16: 20 mL

## 2012-12-16 MED ORDER — CHLORHEXIDINE GLUCONATE 4 % EX LIQD: 60.0000 mL | Freq: Once | CUTANEOUS | Status: DC

## 2012-12-16 MED ORDER — PROPOFOL 10 MG/ML IV BOLUS
INTRAVENOUS | Status: DC | PRN
  Administered 2012-12-16: 200 mg via INTRAVENOUS

## 2012-12-16 MED ORDER — DEXAMETHASONE SODIUM PHOSPHATE 4 MG/ML IJ SOLN
INTRAMUSCULAR | Status: DC | PRN
  Administered 2012-12-16: 10 mg via INTRAVENOUS

## 2012-12-16 MED ORDER — VANCOMYCIN HCL IN DEXTROSE 1-5 GM/200ML-% IV SOLN
INTRAVENOUS | Status: AC
  Filled 2012-12-16: qty 200

## 2012-12-16 MED ORDER — HYDROMORPHONE HCL PF 1 MG/ML IJ SOLN
INTRAMUSCULAR | Status: AC
  Filled 2012-12-16: qty 1

## 2012-12-16 MED ORDER — FENTANYL CITRATE 0.05 MG/ML IJ SOLN
INTRAMUSCULAR | Status: AC
  Filled 2012-12-16: qty 4

## 2012-12-16 MED ORDER — PROPOFOL 10 MG/ML IV BOLUS
INTRAVENOUS | Status: AC
  Filled 2012-12-16: qty 40

## 2012-12-16 MED ORDER — ONDANSETRON HCL 4 MG/2ML IJ SOLN: 4.0000 mg | Freq: Once | INTRAMUSCULAR | Status: DC | PRN

## 2012-12-16 MED ORDER — OXYCODONE HCL 5 MG PO TABS
5.0000 mg | ORAL_TABLET | Freq: Once | ORAL | Status: AC | PRN
  Administered 2012-12-16: 5 mg via ORAL

## 2012-12-16 MED ORDER — FENTANYL CITRATE 0.05 MG/ML IJ SOLN
INTRAMUSCULAR | Status: DC | PRN
  Administered 2012-12-16: 100 ug via INTRAVENOUS

## 2012-12-16 MED ORDER — KETOROLAC TROMETHAMINE 30 MG/ML IJ SOLN: 15.0000 mg | Freq: Once | INTRAMUSCULAR | Status: DC | PRN

## 2012-12-16 MED ORDER — METHYLPREDNISOLONE ACETATE 80 MG/ML IJ SUSP
INTRAMUSCULAR | Status: AC
  Filled 2012-12-16: qty 1

## 2012-12-16 MED ORDER — MIDAZOLAM HCL 2 MG/2ML IJ SOLN
INTRAMUSCULAR | Status: AC
  Filled 2012-12-16: qty 2

## 2012-12-16 MED ORDER — SODIUM CHLORIDE 0.9 % IR SOLN
Status: DC | PRN
  Administered 2012-12-16: 3000 mL

## 2012-12-16 MED ORDER — MIDAZOLAM HCL 2 MG/ML PO SYRP: 12.0000 mg | ORAL_SOLUTION | Freq: Once | ORAL | Status: DC | PRN

## 2012-12-16 MED ORDER — VANCOMYCIN HCL IN DEXTROSE 1-5 GM/200ML-% IV SOLN
1000.0000 mg | INTRAVENOUS | Status: AC
  Administered 2012-12-16 (×2): 1000 mg via INTRAVENOUS

## 2012-12-16 MED ORDER — MORPHINE SULFATE 4 MG/ML IJ SOLN
INTRAMUSCULAR | Status: AC
  Filled 2012-12-16: qty 1

## 2012-12-16 SURGICAL SUPPLY — 42 items
BANDAGE ELASTIC 6 VELCRO ST LF (GAUZE/BANDAGES/DRESSINGS) ×2 IMPLANT
BANDAGE GAUZE ELAST BULKY 4 IN (GAUZE/BANDAGES/DRESSINGS) ×2 IMPLANT
BLADE CUDA 5.5 (BLADE) IMPLANT
BLADE GREAT WHITE 4.2 (BLADE) ×2 IMPLANT
CANISTER SUCT 3000ML (MISCELLANEOUS) IMPLANT
CANISTER SUCT LVC 12 LTR MEDI- (MISCELLANEOUS) ×1 IMPLANT
DRAPE ARTHROSCOPY W/POUCH 114 (DRAPES) ×2 IMPLANT
DRAPE U-SHAPE 47X51 STRL (DRAPES) ×2 IMPLANT
DRSG EMULSION OIL 3X3 NADH (GAUZE/BANDAGES/DRESSINGS) ×2 IMPLANT
DURAPREP 26ML APPLICATOR (WOUND CARE) ×2 IMPLANT
ELECT MENISCUS 165MM 90D (ELECTRODE) IMPLANT
ELECT REM PT RETURN 9FT ADLT (ELECTROSURGICAL)
ELECTRODE REM PT RTRN 9FT ADLT (ELECTROSURGICAL) IMPLANT
GLOVE BIO SURGEON STRL SZ 6.5 (GLOVE) ×1 IMPLANT
GLOVE BIO SURGEON STRL SZ8 (GLOVE) ×1 IMPLANT
GLOVE BIO SURGEON STRL SZ8.5 (GLOVE) ×2 IMPLANT
GLOVE BIOGEL PI IND STRL 7.0 (GLOVE) IMPLANT
GLOVE BIOGEL PI IND STRL 8 (GLOVE) ×1 IMPLANT
GLOVE BIOGEL PI IND STRL 8.5 (GLOVE) ×1 IMPLANT
GLOVE BIOGEL PI INDICATOR 7.0 (GLOVE) ×1
GLOVE BIOGEL PI INDICATOR 8 (GLOVE) ×1
GLOVE BIOGEL PI INDICATOR 8.5 (GLOVE) ×1
GLOVE SS BIOGEL STRL SZ 8 (GLOVE) ×1 IMPLANT
GLOVE SUPERSENSE BIOGEL SZ 8 (GLOVE) ×1
GOWN BRE IMP PREV XXLGXLNG (GOWN DISPOSABLE) ×1 IMPLANT
GOWN PREVENTION PLUS XLARGE (GOWN DISPOSABLE) ×2 IMPLANT
GOWN PREVENTION PLUS XXLARGE (GOWN DISPOSABLE) ×2 IMPLANT
IV NS IRRIG 3000ML ARTHROMATIC (IV SOLUTION) ×1 IMPLANT
KNEE WRAP E Z 3 GEL PACK (MISCELLANEOUS) ×2 IMPLANT
PACK ARTHROSCOPY DSU (CUSTOM PROCEDURE TRAY) ×2 IMPLANT
PACK BASIN DAY SURGERY FS (CUSTOM PROCEDURE TRAY) ×2 IMPLANT
PENCIL BUTTON HOLSTER BLD 10FT (ELECTRODE) IMPLANT
SET ARTHROSCOPY TUBING (MISCELLANEOUS) ×2
SET ARTHROSCOPY TUBING LN (MISCELLANEOUS) ×1 IMPLANT
SHEET MEDIUM DRAPE 40X70 STRL (DRAPES) ×2 IMPLANT
SPONGE GAUZE 4X4 12PLY (GAUZE/BANDAGES/DRESSINGS) ×2 IMPLANT
SYR 3ML 18GX1 1/2 (SYRINGE) IMPLANT
TOWEL OR 17X24 6PK STRL BLUE (TOWEL DISPOSABLE) ×2 IMPLANT
TOWEL OR NON WOVEN STRL DISP B (DISPOSABLE) ×2 IMPLANT
WAND 30 DEG SABER W/CORD (SURGICAL WAND) IMPLANT
WAND STAR VAC 90 (SURGICAL WAND) IMPLANT
WATER STERILE IRR 1000ML POUR (IV SOLUTION) ×2 IMPLANT

## 2012-12-16 NOTE — Anesthesia Postprocedure Evaluation (Signed)
  Anesthesia Post-op Note  Patient: Benjamin Reed  Procedure(s) Performed: Procedure(s): RIGHT ARTHROSCOPY KNEE (Right)  Patient Location: PACU  Anesthesia Type:General  Level of Consciousness: awake, alert  and oriented  Airway and Oxygen Therapy: Patient Spontanous Breathing and Patient connected to nasal cannula oxygen  Post-op Pain: mild  Post-op Assessment: Post-op Vital signs reviewed, Patient's Cardiovascular Status Stable, Respiratory Function Stable, Patent Airway and Pain level controlled  Post-op Vital Signs: stable  Complications: No apparent anesthesia complications

## 2012-12-16 NOTE — Op Note (Deleted)
NAME:  Benjamin Reed, Benjamin Reed                ACCOUNT NO.:  [id number]  MEDICAL RECORD NO.:  [id number]  LOCATION:                               FACILITY:  [facility]  PHYSICIAN:  Kiona Blume G. Selda Jalbert, M.D.DATE OF BIRTH:  04/10/1985  DATE OF PROCEDURE:  12/16/2012 DATE OF DISCHARGE:  12/16/2012                              OPERATIVE REPORT   PREOPERATIVE DIAGNOSIS:  Right knee plica.  PREOPERATIVE DIAGNOSIS:  Right knee plica.  PROCEDURE:  Excision of right knee plica.  ANESTHESIA:  General.  ATTENDING SURGEON:  Dr. Chenel Wernli.  ASSISTANT:  Andrew Nida, PA.  INDICATION FOR PROCEDURE:  The patient is a 27-year-old man with a long history of right knee pain.  This has persisted despite oral anti- inflammatories and an injection.  He has pain which is limiting to him. He had a similar problem with his opposite knee a year or two ago and it responded well to plica excision is offered the same on this side. Informed operative consent was obtained after discussion of possible complications including reaction to anesthesia and infection.  SUMMARY OF FINDINGS AND PROCEDURE:  Under general anesthesia, an arthroscopy of the right knee was performed.  The suprapatellar pouch was benign except for a small suprapatellar plica which we excised.  The patellofemoral joint did not show any degenerative change.  He did have a pathologic plica along the medial shelf and this was excised.  The medial compartment showed no evidence of meniscal or articular cartilage injury.  The ACL was normal.  The lateral compartment also showed no evidence of articular or meniscal cartilage injury.  He was discharged home same day.  DESCRIPTION OF PROCEDURE:  The patient was taken to operating suite where general anesthetic was applied without difficulty.  He was positioned supine and prepped and draped in normal sterile fashion. After the administration of preop IV vancomycin, and an appropriate time out, arthroscopy of the  right knee was performed through total of 2 portals.  Findings were as noted above and procedure consisted of the plica excision done with a shaver.  The knee was thoroughly irrigated, followed by placement of Marcaine with morphine.  Adaptic was placed over the portals, followed by dry gauze and loose Ace wrap.  Estimated blood loss and fluids obtained from anesthesia records.  DISPOSITION:  The patient was extubated in the operating room and taken to recovery room in stable addition.  He is to go home same-day and followup in the office closely.  I will contact him by phone tonight.     Benjamin Reed, M.D.     PGD/MEDQ  D:  12/16/2012  T:  12/16/2012  Job:  [id number] 

## 2012-12-16 NOTE — Anesthesia Procedure Notes (Signed)
Procedure Name: LMA Insertion Date/Time: 12/16/2012 7:37 AM Performed by: Velna Ochs Pre-anesthesia Checklist: Patient identified, Emergency Drugs available, Suction available and Patient being monitored Patient Re-evaluated:Patient Re-evaluated prior to inductionOxygen Delivery Method: Circle System Utilized Preoxygenation: Pre-oxygenation with 100% oxygen Intubation Type: IV induction Ventilation: Mask ventilation without difficulty LMA: LMA inserted LMA Size: 4.0 Number of attempts: 1 Airway Equipment and Method: bite block Placement Confirmation: positive ETCO2 and breath sounds checked- equal and bilateral Tube secured with: Tape Dental Injury: Teeth and Oropharynx as per pre-operative assessment

## 2012-12-16 NOTE — Anesthesia Preprocedure Evaluation (Signed)
Anesthesia Evaluation  Patient identified by MRN, date of birth, ID band Patient awake    Reviewed: Allergy & Precautions, H&P , NPO status , Patient's Chart, lab work & pertinent test results  Airway Mallampati: I TM Distance: >3 FB Neck ROM: Full    Dental  (+) Teeth Intact   Pulmonary former smoker,  breath sounds clear to auscultation        Cardiovascular Rhythm:Regular Rate:Normal     Neuro/Psych    GI/Hepatic   Endo/Other    Renal/GU      Musculoskeletal   Abdominal   Peds  Hematology   Anesthesia Other Findings   Reproductive/Obstetrics                           Anesthesia Physical Anesthesia Plan  ASA: II  Anesthesia Plan: General   Post-op Pain Management:    Induction: Intravenous  Airway Management Planned: LMA  Additional Equipment:   Intra-op Plan:   Post-operative Plan: Extubation in OR  Informed Consent: I have reviewed the patients History and Physical, chart, labs and discussed the procedure including the risks, benefits and alternatives for the proposed anesthesia with the patient or authorized representative who has indicated his/her understanding and acceptance.   Dental advisory given  Plan Discussed with: CRNA and Anesthesiologist  Anesthesia Plan Comments: (Plica, arthropathy R. Knee H/O esophagitis  Plan GA with LMA  Kipp Brood, MD)        Anesthesia Quick Evaluation

## 2012-12-16 NOTE — Interval H&P Note (Signed)
History and Physical Interval Note:  12/16/2012 7:25 AM  Benjamin Reed  has presented today for surgery, with the diagnosis of RIGHT KNEE CHONDROMALACIA AND PLICA  The various methods of treatment have been discussed with the patient and family. After consideration of risks, benefits and other options for treatment, the patient has consented to  Procedure(s): RIGHT ARTHROSCOPY KNEE (Right) as a surgical intervention .  The patient's history has been reviewed, patient examined, no change in status, stable for surgery.  I have reviewed the patient's chart and labs.  Questions were answered to the patient's satisfaction.     Mccauley Diehl G

## 2012-12-16 NOTE — Transfer of Care (Signed)
Immediate Anesthesia Transfer of Care Note  Patient: Benjamin Reed  Procedure(s) Performed: Procedure(s): RIGHT ARTHROSCOPY KNEE (Right)  Patient Location: PACU  Anesthesia Type:General  Level of Consciousness: awake, alert  and oriented  Airway & Oxygen Therapy: Patient Spontanous Breathing and Patient connected to face mask oxygen  Post-op Assessment: Report given to PACU RN, Post -op Vital signs reviewed and stable and Patient moving all extremities  Post vital signs: Reviewed and stable  Complications: No apparent anesthesia complications

## 2012-12-16 NOTE — Op Note (Signed)
#  [id number] 

## 2012-12-16 NOTE — Op Note (Signed)
NAMEHUBBERT, LANDRIGAN NO.:  [id number]  MEDICAL RECORD NO.:  [id number]  LOCATION:                               FACILITY:  [facility]  PHYSICIAN:  Lubertha Basque. Mikylah Ackroyd, M.D.DATE OF BIRTH:  03-06-1985  DATE OF PROCEDURE:  12/16/2012 DATE OF DISCHARGE:  12/16/2012                              OPERATIVE REPORT   PREOPERATIVE DIAGNOSIS:  Right knee plica.  PREOPERATIVE DIAGNOSIS:  Right knee plica.  PROCEDURE:  Excision of right knee plica.  ANESTHESIA:  General.  ATTENDING SURGEON:  Dr. Jerl Santos.  ASSISTANT:  Elodia Florence, PA.  INDICATION FOR PROCEDURE:  The patient is a 27 year old man with a long history of right knee pain.  This has persisted despite oral anti- inflammatories and an injection.  He has pain which is limiting to him. He had a similar problem with his opposite knee a year or two ago and it responded well to plica excision is offered the same on this side. Informed operative consent was obtained after discussion of possible complications including reaction to anesthesia and infection.  SUMMARY OF FINDINGS AND PROCEDURE:  Under general anesthesia, an arthroscopy of the right knee was performed.  The suprapatellar pouch was benign except for a small suprapatellar plica which we excised.  The patellofemoral joint did not show any degenerative change.  He did have a pathologic plica along the medial shelf and this was excised.  The medial compartment showed no evidence of meniscal or articular cartilage injury.  The ACL was normal.  The lateral compartment also showed no evidence of articular or meniscal cartilage injury.  He was discharged home same day.  DESCRIPTION OF PROCEDURE:  The patient was taken to operating suite where general anesthetic was applied without difficulty.  He was positioned supine and prepped and draped in normal sterile fashion. After the administration of preop IV vancomycin, and an appropriate time out, arthroscopy of the  right knee was performed through total of 2 portals.  Findings were as noted above and procedure consisted of the plica excision done with a shaver.  The knee was thoroughly irrigated, followed by placement of Marcaine with morphine.  Adaptic was placed over the portals, followed by dry gauze and loose Ace wrap.  Estimated blood loss and fluids obtained from anesthesia records.  DISPOSITION:  The patient was extubated in the operating room and taken to recovery room in stable addition.  He is to go home same-day and followup in the office closely.  I will contact him by phone tonight.     Lubertha Basque Jerl Santos, M.D.     PGD/MEDQ  D:  12/16/2012  T:  12/16/2012  Job:  [id number]

## 2012-12-17 ENCOUNTER — Encounter (HOSPITAL_BASED_OUTPATIENT_CLINIC_OR_DEPARTMENT_OTHER): Payer: Self-pay | Admitting: Orthopaedic Surgery

## 2013-03-07 ENCOUNTER — Emergency Department (HOSPITAL_COMMUNITY): Payer: BC Managed Care – PPO

## 2013-03-07 ENCOUNTER — Emergency Department (HOSPITAL_COMMUNITY)
Admission: EM | Admit: 2013-03-07 | Discharge: 2013-03-07 | Disposition: A | Discharge status: Home / Self Care | Payer: BC Managed Care – PPO | Attending: Emergency Medicine | Admitting: Emergency Medicine

## 2013-03-07 ENCOUNTER — Encounter (HOSPITAL_COMMUNITY): Payer: Self-pay | Admitting: Emergency Medicine

## 2013-03-07 DIAGNOSIS — G8929 Other chronic pain: Secondary | ICD-10-CM | POA: Insufficient documentation

## 2013-03-07 DIAGNOSIS — R300 Dysuria: Secondary | ICD-10-CM | POA: Insufficient documentation

## 2013-03-07 DIAGNOSIS — R350 Frequency of micturition: Secondary | ICD-10-CM | POA: Insufficient documentation

## 2013-03-07 DIAGNOSIS — Z87891 Personal history of nicotine dependence: Secondary | ICD-10-CM | POA: Insufficient documentation

## 2013-03-07 DIAGNOSIS — M545 Low back pain, unspecified: Secondary | ICD-10-CM | POA: Insufficient documentation

## 2013-03-07 DIAGNOSIS — Z8739 Personal history of other diseases of the musculoskeletal system and connective tissue: Secondary | ICD-10-CM | POA: Insufficient documentation

## 2013-03-07 DIAGNOSIS — R109 Unspecified abdominal pain: Secondary | ICD-10-CM | POA: Insufficient documentation

## 2013-03-07 DIAGNOSIS — Z79899 Other long term (current) drug therapy: Secondary | ICD-10-CM | POA: Insufficient documentation

## 2013-03-07 DIAGNOSIS — Z8719 Personal history of other diseases of the digestive system: Secondary | ICD-10-CM | POA: Insufficient documentation

## 2013-03-07 DIAGNOSIS — R112 Nausea with vomiting, unspecified: Secondary | ICD-10-CM | POA: Insufficient documentation

## 2013-03-07 DIAGNOSIS — Z8669 Personal history of other diseases of the nervous system and sense organs: Secondary | ICD-10-CM | POA: Insufficient documentation

## 2013-03-07 LAB — CBC WITH DIFFERENTIAL/PLATELET
BASOS ABS: 0 10*3/uL (ref 0.0–0.1)
BASOS PCT: 1 % (ref 0–1)
Eosinophils Absolute: 0.2 10*3/uL (ref 0.0–0.7)
Eosinophils Relative: 3 % (ref 0–5)
HCT: 45.3 % (ref 39.0–52.0)
Hemoglobin: 15.8 g/dL (ref 13.0–17.0)
Lymphocytes Relative: 22 % (ref 12–46)
Lymphs Abs: 1.4 10*3/uL (ref 0.7–4.0)
MCH: 30.8 pg (ref 26.0–34.0)
MCHC: 34.9 g/dL (ref 30.0–36.0)
MCV: 88.3 fL (ref 78.0–100.0)
Monocytes Absolute: 0.5 10*3/uL (ref 0.1–1.0)
Monocytes Relative: 7 % (ref 3–12)
Neutro Abs: 4.1 10*3/uL (ref 1.7–7.7)
Neutrophils Relative %: 67 % (ref 43–77)
Platelets: 228 10*3/uL (ref 150–400)
RBC: 5.13 MIL/uL (ref 4.22–5.81)
RDW: 13.1 % (ref 11.5–15.5)
WBC: 6.2 10*3/uL (ref 4.0–10.5)

## 2013-03-07 LAB — COMPREHENSIVE METABOLIC PANEL
ALBUMIN: 4.4 g/dL (ref 3.5–5.2)
ALK PHOS: 55 U/L (ref 39–117)
ALT: 20 U/L (ref 0–53)
AST: 16 U/L (ref 0–37)
BILIRUBIN TOTAL: 0.6 mg/dL (ref 0.3–1.2)
BUN: 13 mg/dL (ref 6–23)
CHLORIDE: 99 meq/L (ref 96–112)
CO2: 28 mEq/L (ref 19–32)
CREATININE: 0.77 mg/dL (ref 0.50–1.35)
Calcium: 9.5 mg/dL (ref 8.4–10.5)
GFR calc Af Amer: >90 mL/min (ref >90)
GFR calc non Af Amer: >90 mL/min (ref >90)
Glucose, Bld: 88 mg/dL (ref 70–99)
POTASSIUM: 3.7 meq/L (ref 3.7–5.3)
SODIUM: 141 meq/L (ref 137–147)
Total Protein: 7.6 g/dL (ref 6.0–8.3)

## 2013-03-07 LAB — URINALYSIS, ROUTINE W REFLEX MICROSCOPIC
BILIRUBIN URINE: NEGATIVE
GLUCOSE, UA: NEGATIVE mg/dL
HGB URINE DIPSTICK: NEGATIVE
KETONES UR: NEGATIVE mg/dL
Leukocytes, UA: NEGATIVE
Nitrite: NEGATIVE
PROTEIN: NEGATIVE mg/dL
Specific Gravity, Urine: 1.007 (ref 1.005–1.030)
Urobilinogen, UA: 0.2 mg/dL (ref 0.0–1.0)
pH: 7.5 (ref 5.0–8.0)

## 2013-03-07 LAB — LIPASE, BLOOD: Lipase: 48 U/L (ref 11–59)

## 2013-03-07 MED ORDER — HYDROCODONE-ACETAMINOPHEN 5-325 MG PO TABS: 2.0000 | ORAL_TABLET | ORAL | Status: DC | PRN

## 2013-03-07 MED ORDER — PROMETHAZINE HCL 25 MG PO TABS: 25.0000 mg | ORAL_TABLET | Freq: Four times a day (QID) | ORAL | Status: DC | PRN

## 2013-03-07 MED ORDER — MORPHINE SULFATE 4 MG/ML IJ SOLN
4.0000 mg | Freq: Once | INTRAMUSCULAR | Status: AC
  Administered 2013-03-07: 4 mg via INTRAVENOUS
  Filled 2013-03-07: qty 1

## 2013-03-07 MED ORDER — IOHEXOL 300 MG/ML  SOLN
25.0000 mL | INTRAMUSCULAR | Status: AC
  Administered 2013-03-07: 25 mL via ORAL

## 2013-03-07 MED ORDER — IOHEXOL 300 MG/ML  SOLN
100.0000 mL | Freq: Once | INTRAMUSCULAR | Status: AC | PRN
  Administered 2013-03-07: 100 mL via INTRAVENOUS

## 2013-03-07 MED ORDER — ONDANSETRON HCL 4 MG/2ML IJ SOLN
4.0000 mg | Freq: Once | INTRAMUSCULAR | Status: AC
  Administered 2013-03-07: 4 mg via INTRAVENOUS
  Filled 2013-03-07: qty 2

## 2013-03-07 MED ORDER — SODIUM CHLORIDE 0.9 % IV BOLUS (SEPSIS)
1000.0000 mL | Freq: Once | INTRAVENOUS | Status: AC
  Administered 2013-03-07: 1000 mL via INTRAVENOUS

## 2013-03-07 NOTE — ED Notes (Signed)
CT notified that pt has finished his contrast. 

## 2013-03-07 NOTE — ED Provider Notes (Signed)
CSN: [id number]     Arrival date & time 03/07/13  1240 History   First MD Initiated Contact with Patient 03/07/13 1257     Chief Complaint  Patient presents with  . Flank Pain  . Abdominal Pain   (Consider location/radiation/quality/duration/timing/severity/associated sxs/prior Treatment) HPI  28 year old male with history of chronic low back pain, history of IBS who presents complaining of abdominal pain and flank pain. Patient was sent here from urgent care Center. Patient reports gradual onset of abdominal flank pain for the past 4 days. States pain initially started around his mid abdomen, subsequently travels to his left flank and now the pain radiates from L flank to his left low abdomen. Pain is described as a sharp sensation, initially waxing and waning but now has become persistent. Nothing seems to make the pain better or worse. He tries taking Tylenol with minimal relief. He did vomit once earlier today. Vomitus is nonbloody nonbilious. Currently endorse nausea. States he is having urinary frequency, with mild burning when urinates. Denies any fever chills, chest pain, shortness of breath, productive cough, hematuria, hematochezia or melena. No penile rash or scrotal pain. No prior history of kidney stone. No prior abdominal surgery. No history of hernia. Last bowel movement was this morning and was normal.  Past Medical History  Diagnosis Date  . IBS (irritable bowel syndrome)     no current meds.  . Chronic lower back pain     history of radiofrequency ablation  . Eosinophilic esophagitis   . 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 History of febrile seizure     as a child  . Nasal congestion 12/09/2012   Past Surgical History  Procedure Laterality Date  . Hemorrhoid surgery  04/12/2010    procedure for prolapse and hemorrhoids  . Knee arthroscopy Left 2013  . Knee arthroscopy Right 12/16/2012    Procedure: RIGHT ARTHROSCOPY KNEE;  Surgeon: Hessie Dibble, MD;  Location:  [facility];  Service: Orthopedics;  Laterality: Right;   Family History  Problem Relation Age of Onset  . Hyperlipidemia Mother     GPs  . Hypertension Mother     GPs  . Prostate cancer Paternal Grandfather   . Colon cancer      GGM dx in her 9s to 70s  . Colon polyps      GF dx in his late 50s, aunt   History  Substance Use Topics  . Smoking status: Former Smoker    Quit date: 11/07/2012  . Smokeless tobacco: Current User  . Alcohol Use: Yes     Comment: occasionally    Review of Systems  All other systems reviewed and are negative.    Allergies  Zoloft; Cephalexin; and Other  Home Medications   Current Outpatient Rx  Name  Route  Sig  Dispense  Refill  . HYDROcodone-acetaminophen (NORCO/VICODIN) 5-325 MG per tablet   Oral   Take 2 tablets by mouth every 6 (six) hours as needed for moderate pain.   30 tablet   0   . methocarbamol (ROBAXIN) 500 MG tablet   Oral   Take 500 mg by mouth 2 (two) times daily.          BP 141/92  Pulse 98  Temp(Src) 98 F (36.7 C) (Oral)  Resp 20  Ht 5\' 10"  (1.778 m)  Wt 135 lb 4.8 oz (61.372 kg)  BMI 19.41 kg/m2  SpO2 99% Physical Exam  Constitutional: He appears well-developed and well-nourished. No distress.  HENT:  Head: Atraumatic.  Eyes: Conjunctivae are normal.  Neck: Normal range of motion. Neck supple.  Cardiovascular: Normal rate and regular rhythm.   Pulmonary/Chest: Effort normal and breath sounds normal.  Abdominal: Soft. Bowel sounds are normal. There is tenderness (suprapubic tenderness on palpation. No Murphy sign, no McBurney's point. No hernia noted. No guarding or rebound tenderness). There is no rebound and no guarding.  Genitourinary: Penis normal. No penile tenderness.  No inguinal hernia.  No CVA tenderness   Neurological: He is alert.  Skin: No rash noted.  Psychiatric: He has a normal mood and affect.    ED Course  Procedures (including critical care time)  1:14  PM Patient here with abdominal and flank pain. Symptoms suggestive of kidney stone. We'll obtain UA, and blood work. We'll treat symptoms. If UA neg for hematuria or infection, will consider abd/pelvis with contrast to r/o appy or acute emergent condition.   1:49 PM UA neg for infection or hematuria.  Will obtain abd/pelvis CT w/CM for further evaluation.  Care discussed with Dr. Mingo Amber.    5:23 PM Patient has normal urine, normal CBC, normal CMP, normal lipase, abdominal and pelvis CT scan without any acute finding. Although the appendix is unable to visualize there is no peri-appendical inflammation to suggest appendicitis. On reevaluation, patient does report periumbilical abdominal pain but is afebrile with stable normal vital sign. We'll treat his symptoms but I suspect patient may benefit from serial abdominal exam, for further management.  Pt agrees with plan.  He also has a PCP and agrees to follow up.    Labs Review Labs Reviewed  CBC WITH DIFFERENTIAL  COMPREHENSIVE METABOLIC PANEL  LIPASE, BLOOD  URINALYSIS, ROUTINE W REFLEX MICROSCOPIC   Imaging Review Ct Abdomen Pelvis W Contrast  03/07/2013   CLINICAL DATA:  Abdominal pain  EXAM: CT ABDOMEN AND PELVIS WITH CONTRAST  TECHNIQUE: Multidetector CT imaging of the abdomen and pelvis was performed using the standard protocol following bolus administration of intravenous contrast. Oral contrast was also administered.  CONTRAST:  172mL OMNIPAQUE IOHEXOL 300 MG/ML  SOLN  COMPARISON:  February 04, 2010  FINDINGS: Lung bases are clear.  Liver is prominent, measuring 17.9 cm in length. No focal liver lesions are identified. There is no biliary duct dilatation. Gallbladder wall does not appear appreciably thickened.  Spleen, pancreas, and adrenals appear normal.  Kidneys bilaterally show no mass or hydronephrosis on either side. There is no renal or ureteral calculus on either side.  In the pelvis, the urinary bladder is midline with normal wall  thickness. Cecum impresses upon the urinary bladder. There is apparent postoperative change in the rectal region. Appendix is not seen; there is no periappendiceal region inflammation.  There is no bowel obstruction. There is no free air or portal venous air.  There is no ascites, adenopathy, or abscess in the abdomen pelvis. Aorta is nonaneurysmal. There are no blastic or lytic bone lesions.  IMPRESSION: Liver is prominent but otherwise appears normal.  There is no mesenteric inflammation or abscess. No bowel obstruction. There is no periappendiceal region inflammation. Appendix is not seen. No renal or ureteral calculus. No hydronephrosis.  There is postoperative change in the rectal region.   Electronically Signed   By: Lowella Grip M.D.   On: 03/07/2013 17:04    EKG Interpretation   None       MDM   1. Abdominal pain    BP 121/81  Pulse 83  Temp(Src) 98 F (36.7 C) (Oral)  Resp 16  Ht 5\' 10"  (1.778 m)  Wt 135 lb 4.8 oz (61.372 kg)  BMI 19.41 kg/m2  SpO2 100%  I have reviewed nursing notes and vital signs. I personally reviewed the imaging tests through PACS system  I reviewed available ER/hospitalization records thought the EMR     Domenic Moras, [place] 03/07/13 1754

## 2013-03-07 NOTE — ED Provider Notes (Signed)
Medical screening examination/treatment/procedure(s) were performed by non-physician practitioner and as supervising physician I was immediately available for consultation/collaboration.  EKG Interpretation   None         Osvaldo Shipper, MD 03/07/13 2006

## 2013-03-07 NOTE — ED Notes (Signed)
Pt was sent here from [facility]. Having back pain/flank pain for several days. Having urinary urgency but only able to urinate small amounts and having lower abd and LLQ pain. Having n/v x 1.

## 2013-03-07 NOTE — Discharge Instructions (Signed)
Abdominal Pain, Adult °Many things can cause abdominal pain. Usually, abdominal pain is not caused by a disease and will improve without treatment. It can often be observed and treated at home. Your health care provider will do a physical exam and possibly order blood tests and X-rays to help determine the seriousness of your pain. However, in many cases, more time must pass before a clear cause of the pain can be found. Before that point, your health care provider may not know if you need more testing or further treatment. °HOME CARE INSTRUCTIONS  °Monitor your abdominal pain for any changes. The following actions may help to alleviate any discomfort you are experiencing: °· Only take over-the-counter or prescription medicines as directed by your health care provider. °· Do not take laxatives unless directed to do so by your health care provider. °· Try a clear liquid diet (broth, tea, or water) as directed by your health care provider. Slowly move to a bland diet as tolerated. °SEEK MEDICAL CARE IF: °· You have unexplained abdominal pain. °· You have abdominal pain associated with nausea or diarrhea. °· You have pain when you urinate or have a bowel movement. °· You experience abdominal pain that wakes you in the night. °· You have abdominal pain that is worsened or improved by eating food. °· You have abdominal pain that is worsened with eating fatty foods. °SEEK IMMEDIATE MEDICAL CARE IF:  °· Your pain does not go away within 2 hours. °· You have a fever. °· You keep throwing up (vomiting). °· Your pain is felt only in portions of the abdomen, such as the right side or the left lower portion of the abdomen. °· You pass bloody or black tarry stools. °MAKE SURE YOU: °· Understand these instructions.   °· Will watch your condition.   °· Will get help right away if you are not doing well or get worse.   °Document Released: 10/25/2004 Document Revised: 11/05/2012 Document Reviewed: 09/24/2012 °[employer]® Patient  Information ©2014 [employer]. ° °

## 2013-03-11 ENCOUNTER — Encounter (HOSPITAL_COMMUNITY): Payer: Self-pay | Admitting: Emergency Medicine

## 2013-03-11 DIAGNOSIS — Z8669 Personal history of other diseases of the nervous system and sense organs: Secondary | ICD-10-CM | POA: Insufficient documentation

## 2013-03-11 DIAGNOSIS — Z8739 Personal history of other diseases of the musculoskeletal system and connective tissue: Secondary | ICD-10-CM | POA: Insufficient documentation

## 2013-03-11 DIAGNOSIS — G8929 Other chronic pain: Secondary | ICD-10-CM | POA: Insufficient documentation

## 2013-03-11 DIAGNOSIS — R197 Diarrhea, unspecified: Secondary | ICD-10-CM | POA: Insufficient documentation

## 2013-03-11 DIAGNOSIS — Z87891 Personal history of nicotine dependence: Secondary | ICD-10-CM | POA: Insufficient documentation

## 2013-03-11 DIAGNOSIS — R1011 Right upper quadrant pain: Secondary | ICD-10-CM | POA: Insufficient documentation

## 2013-03-11 DIAGNOSIS — R112 Nausea with vomiting, unspecified: Secondary | ICD-10-CM | POA: Insufficient documentation

## 2013-03-11 DIAGNOSIS — Z8719 Personal history of other diseases of the digestive system: Secondary | ICD-10-CM | POA: Insufficient documentation

## 2013-03-11 LAB — COMPREHENSIVE METABOLIC PANEL
ALT: 19 U/L (ref 0–53)
AST: 20 U/L (ref 0–37)
Albumin: 4.7 g/dL (ref 3.5–5.2)
Alkaline Phosphatase: 55 U/L (ref 39–117)
BUN: 14 mg/dL (ref 6–23)
CALCIUM: 10.9 mg/dL — AB (ref 8.4–10.5)
CO2: 28 meq/L (ref 19–32)
CREATININE: 0.88 mg/dL (ref 0.50–1.35)
Chloride: 98 mEq/L (ref 96–112)
GFR calc Af Amer: >90 mL/min (ref >90)
Glucose, Bld: 90 mg/dL (ref 70–99)
Potassium: 4.2 mEq/L (ref 3.7–5.3)
Sodium: 140 mEq/L (ref 137–147)
Total Bilirubin: 0.8 mg/dL (ref 0.3–1.2)
Total Protein: 7.7 g/dL (ref 6.0–8.3)

## 2013-03-11 LAB — CBC WITH DIFFERENTIAL/PLATELET
BASOS ABS: 0.1 10*3/uL (ref 0.0–0.1)
Basophils Relative: 1 % (ref 0–1)
EOS PCT: 3 % (ref 0–5)
Eosinophils Absolute: 0.2 10*3/uL (ref 0.0–0.7)
HEMATOCRIT: 44 % (ref 39.0–52.0)
Hemoglobin: 15.7 g/dL (ref 13.0–17.0)
Lymphocytes Relative: 30 % (ref 12–46)
Lymphs Abs: 2.2 10*3/uL (ref 0.7–4.0)
MCH: 31.2 pg (ref 26.0–34.0)
MCHC: 35.7 g/dL (ref 30.0–36.0)
MCV: 87.3 fL (ref 78.0–100.0)
MONO ABS: 0.5 10*3/uL (ref 0.1–1.0)
Monocytes Relative: 6 % (ref 3–12)
Neutro Abs: 4.3 10*3/uL (ref 1.7–7.7)
Neutrophils Relative %: 60 % (ref 43–77)
Platelets: 220 10*3/uL (ref 150–400)
RBC: 5.04 MIL/uL (ref 4.22–5.81)
RDW: 13 % (ref 11.5–15.5)
WBC: 7.1 10*3/uL (ref 4.0–10.5)

## 2013-03-11 LAB — LIPASE, BLOOD: Lipase: 39 U/L (ref 11–59)

## 2013-03-11 NOTE — ED Notes (Signed)
RUQ pain, nausea, and vomiting after eating fast food since Monday.  Also reports R shoulder pain today after eating [employer].

## 2013-03-12 ENCOUNTER — Emergency Department (HOSPITAL_COMMUNITY): Payer: BC Managed Care – PPO

## 2013-03-12 ENCOUNTER — Emergency Department (HOSPITAL_COMMUNITY)
Admission: EM | Admit: 2013-03-12 | Discharge: 2013-03-12 | Disposition: A | Discharge status: Home / Self Care | Payer: BC Managed Care – PPO | Attending: Emergency Medicine | Admitting: Emergency Medicine

## 2013-03-12 DIAGNOSIS — R1010 Upper abdominal pain, unspecified: Secondary | ICD-10-CM

## 2013-03-12 LAB — URINALYSIS, ROUTINE W REFLEX MICROSCOPIC
Bilirubin Urine: NEGATIVE
Glucose, UA: NEGATIVE mg/dL
Hgb urine dipstick: NEGATIVE
Ketones, ur: NEGATIVE mg/dL
LEUKOCYTES UA: NEGATIVE
NITRITE: NEGATIVE
PROTEIN: NEGATIVE mg/dL
Specific Gravity, Urine: 1.017 (ref 1.005–1.030)
UROBILINOGEN UA: 0.2 mg/dL (ref 0.0–1.0)
pH: 7.5 (ref 5.0–8.0)

## 2013-03-12 MED ORDER — DICYCLOMINE HCL 20 MG PO TABS: 20.0000 mg | ORAL_TABLET | Freq: Four times a day (QID) | ORAL | Status: DC | PRN

## 2013-03-12 MED ORDER — ONDANSETRON 8 MG PO TBDP: 8.0000 mg | ORAL_TABLET | Freq: Three times a day (TID) | ORAL | Status: DC | PRN

## 2013-03-12 MED ORDER — HYDROMORPHONE HCL PF 1 MG/ML IJ SOLN
0.5000 mg | Freq: Once | INTRAMUSCULAR | Status: AC
  Administered 2013-03-12: 0.5 mg via INTRAVENOUS
  Filled 2013-03-12: qty 1

## 2013-03-12 MED ORDER — ONDANSETRON HCL 4 MG/2ML IJ SOLN
4.0000 mg | Freq: Once | INTRAMUSCULAR | Status: AC
  Administered 2013-03-12: 4 mg via INTRAVENOUS
  Filled 2013-03-12: qty 2

## 2013-03-12 MED ORDER — SODIUM CHLORIDE 0.9 % IV BOLUS (SEPSIS)
1000.0000 mL | Freq: Once | INTRAVENOUS | Status: AC
  Administered 2013-03-12: 1000 mL via INTRAVENOUS

## 2013-03-12 NOTE — ED Notes (Signed)
Ultrasound at bedside

## 2013-03-12 NOTE — ED Provider Notes (Signed)
Medical screening examination/treatment/procedure(s) were conducted as a shared visit with non-physician practitioner(s) and myself.  I personally evaluated the patient during the encounter.  EKG Interpretation   None      Please see my note from this visit  Kalman Drape, MD 03/12/13 2332

## 2013-03-12 NOTE — ED Notes (Signed)
Pt A&Ox4, ambulatory at discharge, verbalizing no complaints at this time. 

## 2013-03-12 NOTE — Discharge Instructions (Signed)
Avoid fatty, greasy foods.  Stick to a bland diet until feeling better.  Follow up with your primary care doctor for further workup and evaluation.  You may need referral to a GI specialist.  Your ultrasound today does not show any signs of gallbladder problems.  Your labs are normal.     Abdominal Pain, Adult Many things can cause abdominal pain. Usually, abdominal pain is not caused by a disease and will improve without treatment. It can often be observed and treated at home. Your health care provider will do a physical exam and possibly order blood tests and X-rays to help determine the seriousness of your pain. However, in many cases, more time must pass before a clear cause of the pain can be found. Before that point, your health care provider may not know if you need more testing or further treatment. HOME CARE INSTRUCTIONS  Monitor your abdominal pain for any changes. The following actions may help to alleviate any discomfort you are experiencing:  Only take over-the-counter or prescription medicines as directed by your health care provider.  Do not take laxatives unless directed to do so by your health care provider.  Try a clear liquid diet (broth, tea, or water) as directed by your health care provider. Slowly move to a bland diet as tolerated. SEEK MEDICAL CARE IF:  You have unexplained abdominal pain.  You have abdominal pain associated with nausea or diarrhea.  You have pain when you urinate or have a bowel movement.  You experience abdominal pain that wakes you in the night.  You have abdominal pain that is worsened or improved by eating food.  You have abdominal pain that is worsened with eating fatty foods. SEEK IMMEDIATE MEDICAL CARE IF:   Your pain does not go away within 2 hours.  You have a fever.  You keep throwing up (vomiting).  Your pain is felt only in portions of the abdomen, such as the right side or the left lower portion of the abdomen.  You pass  bloody or black tarry stools. MAKE SURE YOU:  Understand these instructions.   Will watch your condition.   Will get help right away if you are not doing well or get worse.  Document Released: 10/25/2004 Document Revised: 11/05/2012 Document Reviewed: 09/24/2012 [facility] Patient Information 2014 [place].

## 2013-03-12 NOTE — ED Provider Notes (Signed)
Medical screening examination/treatment/procedure(s) were conducted as a shared visit with non-physician practitioner(s) and myself.  I personally evaluated the patient during the encounter.  28 year old male with history of IBS, crampy, upper abdominal pain since eating fast food.  This week.  Pain mainly in right upper quadrant.  Patient had CT scan done recently that showed no specific findings.  After having lower abdominal pain.  Plan for ultrasound.  Labs are reviewed, and unremarkable.  Ultrasound also unremarkable.  Patient will be placed on Bentyl and instructed to followup with his primary care Dr. and his GI Dr.  Results for orders placed during the hospital encounter of 03/12/13  CBC WITH DIFFERENTIAL      Result Value Ref Range   WBC 7.1  4.0 - 10.5 K/uL   RBC 5.04  4.22 - 5.81 MIL/uL   Hemoglobin 15.7  13.0 - 17.0 g/dL   HCT 44.0  39.0 - 52.0 %   MCV 87.3  78.0 - 100.0 fL   MCH 31.2  26.0 - 34.0 pg   MCHC 35.7  30.0 - 36.0 g/dL   RDW 13.0  11.5 - 15.5 %   Platelets 220  150 - 400 K/uL   Neutrophils Relative % 60  43 - 77 %   Neutro Abs 4.3  1.7 - 7.7 K/uL   Lymphocytes Relative 30  12 - 46 %   Lymphs Abs 2.2  0.7 - 4.0 K/uL   Monocytes Relative 6  3 - 12 %   Monocytes Absolute 0.5  0.1 - 1.0 K/uL   Eosinophils Relative 3  0 - 5 %   Eosinophils Absolute 0.2  0.0 - 0.7 K/uL   Basophils Relative 1  0 - 1 %   Basophils Absolute 0.1  0.0 - 0.1 K/uL  COMPREHENSIVE METABOLIC PANEL      Result Value Ref Range   Sodium 140  137 - 147 mEq/L   Potassium 4.2  3.7 - 5.3 mEq/L   Chloride 98  96 - 112 mEq/L   CO2 28  19 - 32 mEq/L   Glucose, Bld 90  70 - 99 mg/dL   BUN 14  6 - 23 mg/dL   Creatinine, Ser 0.88  0.50 - 1.35 mg/dL   Calcium 10.9 (*) 8.4 - 10.5 mg/dL   Total Protein 7.7  6.0 - 8.3 g/dL   Albumin 4.7  3.5 - 5.2 g/dL   AST 20  0 - 37 U/L   ALT 19  0 - 53 U/L   Alkaline Phosphatase 55  39 - 117 U/L   Total Bilirubin 0.8  0.3 - 1.2 mg/dL   GFR calc non Af Amer >90   >90 mL/min   GFR calc Af Amer >90  >90 mL/min  LIPASE, BLOOD      Result Value Ref Range   Lipase 39  11 - 59 U/L  URINALYSIS, ROUTINE W REFLEX MICROSCOPIC      Result Value Ref Range   Color, Urine YELLOW  YELLOW   APPearance CLOUDY (*) CLEAR   Specific Gravity, Urine 1.017  1.005 - 1.030   pH 7.5  5.0 - 8.0   Glucose, UA NEGATIVE  NEGATIVE mg/dL   Hgb urine dipstick NEGATIVE  NEGATIVE   Bilirubin Urine NEGATIVE  NEGATIVE   Ketones, ur NEGATIVE  NEGATIVE mg/dL   Protein, ur NEGATIVE  NEGATIVE mg/dL   Urobilinogen, UA 0.2  0.0 - 1.0 mg/dL   Nitrite NEGATIVE  NEGATIVE   Leukocytes, UA NEGATIVE  NEGATIVE   Ct Abdomen Pelvis W Contrast  03/07/2013   CLINICAL DATA:  Abdominal pain  EXAM: CT ABDOMEN AND PELVIS WITH CONTRAST  TECHNIQUE: Multidetector CT imaging of the abdomen and pelvis was performed using the standard protocol following bolus administration of intravenous contrast. Oral contrast was also administered.  CONTRAST:  172mL OMNIPAQUE IOHEXOL 300 MG/ML  SOLN  COMPARISON:  February 04, 2010  FINDINGS: Lung bases are clear.  Liver is prominent, measuring 17.9 cm in length. No focal liver lesions are identified. There is no biliary duct dilatation. Gallbladder wall does not appear appreciably thickened.  Spleen, pancreas, and adrenals appear normal.  Kidneys bilaterally show no mass or hydronephrosis on either side. There is no renal or ureteral calculus on either side.  In the pelvis, the urinary bladder is midline with normal wall thickness. Cecum impresses upon the urinary bladder. There is apparent postoperative change in the rectal region. Appendix is not seen; there is no periappendiceal region inflammation.  There is no bowel obstruction. There is no free air or portal venous air.  There is no ascites, adenopathy, or abscess in the abdomen pelvis. Aorta is nonaneurysmal. There are no blastic or lytic bone lesions.  IMPRESSION: Liver is prominent but otherwise appears normal.  There is no  mesenteric inflammation or abscess. No bowel obstruction. There is no periappendiceal region inflammation. Appendix is not seen. No renal or ureteral calculus. No hydronephrosis.  There is postoperative change in the rectal region.   Electronically Signed   By: Lowella Grip M.D.   On: 03/07/2013 17:04   US Abdomen Limited  03/12/2013   CLINICAL DATA:  Right upper quadrant abdominal pain.  EXAM: US ABDOMEN LIMITED - RIGHT UPPER QUADRANT  COMPARISON:  CT ABD/PELVIS W CM dated 03/07/2013  FINDINGS: Gallbladder:  No gallstones or wall thickening visualized. No sonographic Murphy sign noted.  Common bile duct:  Diameter: 3 mm, normal.  Liver:  No focal lesion identified. Within normal limits in parenchymal echogenicity.  IMPRESSION: Normal right upper quadrant ultrasound.   Electronically Signed   By: Dereck Ligas M.D.   On: 03/12/2013 03:07      Kalman Drape, MD 03/12/13 [phone]

## 2013-03-12 NOTE — ED Provider Notes (Signed)
CSN: [id number]     Arrival date & time 03/11/13  2134 History   First MD Initiated Contact with Patient 03/12/13 0038     Chief Complaint  Patient presents with  . Abdominal Pain     (Consider location/radiation/quality/duration/timing/severity/associated sxs/prior Treatment) HPI Comments: Patient is 28 year old male with history of chronic lower back pain and IBS who presents again tonight with RUQ abdominal pain after eating fast food.  He states that his symptoms initially started on Monday again after eating.  States that along with the pain has noted nausea and vomiting.  States vomited about 7 times with NBNB vomit.  He reports diarrhea this morning without blood but no further episodes.  He denies fever, chills, reports radiation of the pain to his right shoulder today.  He reports pain as sharp and stabbing.  He denies bloating, constipation, burning pain, dysuria, hematuria.  Patient is a 28 y.o. male presenting with abdominal pain. The history is provided by the patient. No language interpreter was used.  Abdominal Pain Pain location:  RUQ Pain quality: sharp and shooting   Pain radiates to:  R flank Pain severity:  Moderate Onset quality:  Sudden Duration:  6 hours Timing:  Constant Progression:  Worsening Chronicity:  Recurrent Context: diet changes and eating   Context: not alcohol use, not medication withdrawal, not previous surgeries, not recent travel, not retching, not sick contacts, not suspicious food intake and not trauma   Relieved by:  Nothing Worsened by:  Eating Ineffective treatments:  None tried Associated symptoms: diarrhea, nausea and vomiting   Associated symptoms: no belching, no chest pain, no constipation, no cough, no fatigue, no fever, no hematemesis, no hematochezia, no hematuria and no shortness of breath   Risk factors: has not had multiple surgeries and no NSAID use     Past Medical History  Diagnosis Date  . IBS (irritable bowel syndrome)     no current meds.  . Chronic lower back pain     history of radiofrequency ablation  . Eosinophilic esophagitis   . 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 History of febrile seizure     as a child  . Nasal congestion 12/09/2012   Past Surgical History  Procedure Laterality Date  . Hemorrhoid surgery  04/12/2010    procedure for prolapse and hemorrhoids  . Knee arthroscopy Left 2013  . Knee arthroscopy Right 12/16/2012    Procedure: RIGHT ARTHROSCOPY KNEE;  Surgeon: Hessie Dibble, MD;  Location: [place];  Service: Orthopedics;  Laterality: Right;   Family History  Problem Relation Age of Onset  . Hyperlipidemia Mother     GPs  . Hypertension Mother     GPs  . Prostate cancer Paternal Grandfather   . Colon cancer      GGM dx in her 86s to 52s  . Colon polyps      GF dx in his late 89s, aunt   History  Substance Use Topics  . Smoking status: Former Smoker    Quit date: 11/07/2012  . Smokeless tobacco: Current User  . Alcohol Use: Yes     Comment: occasionally    Review of Systems  Constitutional: Negative for fever and fatigue.  Respiratory: Negative for cough and shortness of breath.   Cardiovascular: Negative for chest pain.  Gastrointestinal: Positive for nausea, vomiting, abdominal pain and diarrhea. Negative for constipation, hematochezia and hematemesis.  Genitourinary: Negative for hematuria.  All other systems reviewed and are negative.  Allergies  Zoloft; Cephalexin; and Other  Home Medications   Current Outpatient Rx  Name  Route  Sig  Dispense  Refill  . acetaminophen (TYLENOL) 500 MG tablet   Oral   Take 1,000 mg by mouth every 6 (six) hours as needed (pain).         . calcium carbonate (TUMS - DOSED IN MG ELEMENTAL CALCIUM) 500 MG chewable tablet   Oral   Chew 1 tablet by mouth daily as needed for indigestion or heartburn.         . promethazine (PHENERGAN) 25 MG tablet   Oral   Take 1 tablet (25 mg total) by  mouth every 6 (six) hours as needed for nausea.   20 tablet   0    BP 125/87  Pulse 79  Temp(Src) 98.5 F (36.9 C) (Oral)  Resp 20  Ht 5\' 10"  (1.778 m)  Wt 135 lb (61.236 kg)  BMI 19.37 kg/m2  SpO2 100% Physical Exam  Nursing note and vitals reviewed. Constitutional: He is oriented to person, place, and time. He appears well-developed and well-nourished. No distress.  HENT:  Head: Normocephalic and atraumatic.  Right Ear: External ear normal.  Left Ear: External ear normal.  Nose: Nose normal.  Mouth/Throat: Oropharynx is clear and moist. No oropharyngeal exudate.  Eyes: Conjunctivae are normal. Pupils are equal, round, and reactive to light. No scleral icterus.  Neck: Normal range of motion. Neck supple.  Cardiovascular: Normal rate, regular rhythm and normal heart sounds.  Exam reveals no gallop and no friction rub.   No murmur heard. Pulmonary/Chest: Effort normal and breath sounds normal. No respiratory distress. He has no wheezes. He has no rales. He exhibits no tenderness.  Abdominal: Soft. Bowel sounds are normal. He exhibits no distension and no mass. There is tenderness in the right upper quadrant. There is positive Murphy's sign. There is no rebound and no guarding.    Musculoskeletal: Normal range of motion. He exhibits no edema and no tenderness.  Lymphadenopathy:    He has no cervical adenopathy.  Neurological: He is alert and oriented to person, place, and time. He exhibits normal muscle tone. Coordination normal.  Skin: Skin is warm and dry. No rash noted. No erythema. No pallor.  Psychiatric: He has a normal mood and affect. His behavior is normal. Judgment and thought content normal.    ED Course  Procedures (including critical care time) Labs Review Labs Reviewed  COMPREHENSIVE METABOLIC PANEL - Abnormal; Notable for the following:    Calcium 10.9 (*)    All other components within normal limits  CBC WITH DIFFERENTIAL  LIPASE, BLOOD  URINALYSIS,  ROUTINE W REFLEX MICROSCOPIC   Imaging Review No results found.  EKG Interpretation   None      Results for orders placed during the hospital encounter of 03/12/13  CBC WITH DIFFERENTIAL      Result Value Ref Range   WBC 7.1  4.0 - 10.5 K/uL   RBC 5.04  4.22 - 5.81 MIL/uL   Hemoglobin 15.7  13.0 - 17.0 g/dL   HCT 44.0  39.0 - 52.0 %   MCV 87.3  78.0 - 100.0 fL   MCH 31.2  26.0 - 34.0 pg   MCHC 35.7  30.0 - 36.0 g/dL   RDW 13.0  11.5 - 15.5 %   Platelets 220  150 - 400 K/uL   Neutrophils Relative % 60  43 - 77 %   Neutro Abs 4.3  1.7 - 7.7  K/uL   Lymphocytes Relative 30  12 - 46 %   Lymphs Abs 2.2  0.7 - 4.0 K/uL   Monocytes Relative 6  3 - 12 %   Monocytes Absolute 0.5  0.1 - 1.0 K/uL   Eosinophils Relative 3  0 - 5 %   Eosinophils Absolute 0.2  0.0 - 0.7 K/uL   Basophils Relative 1  0 - 1 %   Basophils Absolute 0.1  0.0 - 0.1 K/uL  COMPREHENSIVE METABOLIC PANEL      Result Value Ref Range   Sodium 140  137 - 147 mEq/L   Potassium 4.2  3.7 - 5.3 mEq/L   Chloride 98  96 - 112 mEq/L   CO2 28  19 - 32 mEq/L   Glucose, Bld 90  70 - 99 mg/dL   BUN 14  6 - 23 mg/dL   Creatinine, Ser 0.88  0.50 - 1.35 mg/dL   Calcium 10.9 (*) 8.4 - 10.5 mg/dL   Total Protein 7.7  6.0 - 8.3 g/dL   Albumin 4.7  3.5 - 5.2 g/dL   AST 20  0 - 37 U/L   ALT 19  0 - 53 U/L   Alkaline Phosphatase 55  39 - 117 U/L   Total Bilirubin 0.8  0.3 - 1.2 mg/dL   GFR calc non Af Amer >90  >90 mL/min   GFR calc Af Amer >90  >90 mL/min  LIPASE, BLOOD      Result Value Ref Range   Lipase 39  11 - 59 U/L  URINALYSIS, ROUTINE W REFLEX MICROSCOPIC      Result Value Ref Range   Color, Urine YELLOW  YELLOW   APPearance CLOUDY (*) CLEAR   Specific Gravity, Urine 1.017  1.005 - 1.030   pH 7.5  5.0 - 8.0   Glucose, UA NEGATIVE  NEGATIVE mg/dL   Hgb urine dipstick NEGATIVE  NEGATIVE   Bilirubin Urine NEGATIVE  NEGATIVE   Ketones, ur NEGATIVE  NEGATIVE mg/dL   Protein, ur NEGATIVE  NEGATIVE mg/dL    Urobilinogen, UA 0.2  0.0 - 1.0 mg/dL   Nitrite NEGATIVE  NEGATIVE   Leukocytes, UA NEGATIVE  NEGATIVE   Ct Abdomen Pelvis W Contrast  03/07/2013   CLINICAL DATA:  Abdominal pain  EXAM: CT ABDOMEN AND PELVIS WITH CONTRAST  TECHNIQUE: Multidetector CT imaging of the abdomen and pelvis was performed using the standard protocol following bolus administration of intravenous contrast. Oral contrast was also administered.  CONTRAST:  183mL OMNIPAQUE IOHEXOL 300 MG/ML  SOLN  COMPARISON:  February 04, 2010  FINDINGS: Lung bases are clear.  Liver is prominent, measuring 17.9 cm in length. No focal liver lesions are identified. There is no biliary duct dilatation. Gallbladder wall does not appear appreciably thickened.  Spleen, pancreas, and adrenals appear normal.  Kidneys bilaterally show no mass or hydronephrosis on either side. There is no renal or ureteral calculus on either side.  In the pelvis, the urinary bladder is midline with normal wall thickness. Cecum impresses upon the urinary bladder. There is apparent postoperative change in the rectal region. Appendix is not seen; there is no periappendiceal region inflammation.  There is no bowel obstruction. There is no free air or portal venous air.  There is no ascites, adenopathy, or abscess in the abdomen pelvis. Aorta is nonaneurysmal. There are no blastic or lytic bone lesions.  IMPRESSION: Liver is prominent but otherwise appears normal.  There is no mesenteric inflammation or abscess. No bowel  obstruction. There is no periappendiceal region inflammation. Appendix is not seen. No renal or ureteral calculus. No hydronephrosis.  There is postoperative change in the rectal region.   Electronically Signed   By: Lowella Grip M.D.   On: 03/07/2013 17:04   US Abdomen Limited  03/12/2013   CLINICAL DATA:  Right upper quadrant abdominal pain.  EXAM: US ABDOMEN LIMITED - RIGHT UPPER QUADRANT  COMPARISON:  CT ABD/PELVIS W CM dated 03/07/2013  FINDINGS: Gallbladder:  No  gallstones or wall thickening visualized. No sonographic Murphy sign noted.  Common bile duct:  Diameter: 3 mm, normal.  Liver:  No focal lesion identified. Within normal limits in parenchymal echogenicity.  IMPRESSION: Normal right upper quadrant ultrasound.   Electronically Signed   By: Dereck Ligas M.D.   On: 03/12/2013 03:07     MDM   Final diagnoses:  None   Care of patient turned over to Dr. Sharol Given for disposition, this is likely an exacerbation of the patient's IBS related to high fat food intake.    Idalia Needle Joelyn Oms, PA-C 03/12/13 1827

## 2013-04-03 ENCOUNTER — Emergency Department (HOSPITAL_COMMUNITY)
Admission: EM | Admit: 2013-04-03 | Discharge: 2013-04-04 | Disposition: A | Discharge status: Home / Self Care | Payer: Worker's Compensation | Attending: Emergency Medicine | Admitting: Emergency Medicine

## 2013-04-03 ENCOUNTER — Encounter (HOSPITAL_COMMUNITY): Payer: Self-pay | Admitting: Emergency Medicine

## 2013-04-03 DIAGNOSIS — Y990 Civilian activity done for income or pay: Secondary | ICD-10-CM | POA: Insufficient documentation

## 2013-04-03 DIAGNOSIS — Z87828 Personal history of other (healed) physical injury and trauma: Secondary | ICD-10-CM | POA: Insufficient documentation

## 2013-04-03 DIAGNOSIS — Z8739 Personal history of other diseases of the musculoskeletal system and connective tissue: Secondary | ICD-10-CM | POA: Insufficient documentation

## 2013-04-03 DIAGNOSIS — X500XXA Overexertion from strenuous movement or load, initial encounter: Secondary | ICD-10-CM | POA: Insufficient documentation

## 2013-04-03 DIAGNOSIS — M546 Pain in thoracic spine: Secondary | ICD-10-CM

## 2013-04-03 DIAGNOSIS — Z8719 Personal history of other diseases of the digestive system: Secondary | ICD-10-CM | POA: Insufficient documentation

## 2013-04-03 DIAGNOSIS — G8929 Other chronic pain: Secondary | ICD-10-CM | POA: Insufficient documentation

## 2013-04-03 DIAGNOSIS — S39012A Strain of muscle, fascia and tendon of lower back, initial encounter: Secondary | ICD-10-CM

## 2013-04-03 DIAGNOSIS — S335XXA Sprain of ligaments of lumbar spine, initial encounter: Secondary | ICD-10-CM | POA: Insufficient documentation

## 2013-04-03 DIAGNOSIS — IMO0002 Reserved for concepts with insufficient information to code with codable children: Secondary | ICD-10-CM | POA: Insufficient documentation

## 2013-04-03 DIAGNOSIS — Z87891 Personal history of nicotine dependence: Secondary | ICD-10-CM | POA: Insufficient documentation

## 2013-04-03 DIAGNOSIS — Y9389 Activity, other specified: Secondary | ICD-10-CM | POA: Insufficient documentation

## 2013-04-03 DIAGNOSIS — Z79899 Other long term (current) drug therapy: Secondary | ICD-10-CM | POA: Insufficient documentation

## 2013-04-03 DIAGNOSIS — Y9289 Other specified places as the place of occurrence of the external cause: Secondary | ICD-10-CM | POA: Insufficient documentation

## 2013-04-03 NOTE — ED Notes (Signed)
Pt reports injuring his back on Tuesday while at work while moving some heavy supplies from head level to the floor. Pt states that on Wednesday he was sent to [facility] and was given a muscle relaxer and tylenol 500 mg. Pt followed up with [facility] today, however the patient states his pain is worsening and not improving with his current treatment. 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

## 2013-04-03 NOTE — ED Provider Notes (Signed)
CSN: [id number]     Arrival date & time 04/03/13  2247 History  This chart was scribed for Jeannett Senior, PA-C, working with Elyn Peers, MD by Maree Erie, ED Scribe. This patient was seen in room WTR7/WTR7 and the patient's care was started at 11:22 PM.     Chief Complaint  Patient presents with  . Back Pain     Patient is a 28 y.o. male presenting with back pain. The history is provided by the patient. No language interpreter was used.  Back Pain   HPI Comments: Benjamin Reed is a 28 y.o. male who presents to the Emergency Department complaining of constant lower back pain that began suddenly after he picked up heavy supplies three days ago. He denies the pain radiates to his legs. The pain is worsened when he steps on his left foot. He denies bowel or bladder incontinence or fever.  He was given muscle relaxer and Tylenol three days ago by [facility]. He was unable to follow up face to face because they were too busy. He was able to go to work today but has not been lifting. 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 He last injured his lower back a few years ago that was relieved by physical therapy and back injections by Dr. Jacelyn Grip. He has a follow up with [facility] on 04/09/13.    Past Medical History  Diagnosis Date  . IBS (irritable bowel syndrome)     no current meds.  . Chronic lower back pain     history of radiofrequency ablation  . Eosinophilic esophagitis   . 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 History of febrile seizure     as a child  . Nasal congestion 12/09/2012   Past Surgical History  Procedure Laterality Date  . Hemorrhoid surgery  04/12/2010    procedure for prolapse and hemorrhoids  . Knee arthroscopy Left 2013  . Knee arthroscopy Right 12/16/2012    Procedure: RIGHT ARTHROSCOPY KNEE;  Surgeon: Hessie Dibble, MD;  Location: [place];  Service:  Orthopedics;  Laterality: Right;   Family History  Problem Relation Age of Onset  . Hyperlipidemia Mother     GPs  . Hypertension Mother     GPs  . Prostate cancer Paternal Grandfather   . Colon cancer      GGM dx in her 78s to 44s  . Colon polyps      GF dx in his late 17s, aunt   History  Substance Use Topics  . Smoking status: Former Smoker    Quit date: 11/07/2012  . Smokeless tobacco: Current User  . Alcohol Use: Yes     Comment: occasionally    Review of Systems  Musculoskeletal: Positive for back pain.      Allergies  Zoloft; Cephalexin; and Other  Home Medications   Current Outpatient Rx  Name  Route  Sig  Dispense  Refill  . acetaminophen (TYLENOL) 500 MG tablet   Oral   Take 1,000 mg by mouth every 6 (six) hours as needed (pain).         . calcium carbonate (TUMS - DOSED IN MG ELEMENTAL CALCIUM) 500 MG chewable tablet   Oral   Chew 1 tablet by mouth daily as needed for indigestion or heartburn.         . dicyclomine (BENTYL) 20 MG tablet  Oral   Take 1 tablet (20 mg total) by mouth every 6 (six) hours as needed for spasms (for abdominal cramping).   20 tablet   0   . ondansetron (ZOFRAN ODT) 8 MG disintegrating tablet   Oral   Take 1 tablet (8 mg total) by mouth every 8 (eight) hours as needed for nausea or vomiting.   20 tablet   0   . promethazine (PHENERGAN) 25 MG tablet   Oral   Take 1 tablet (25 mg total) by mouth every 6 (six) hours as needed for nausea.   20 tablet   0    Triage Vitals: BP 129/85  Pulse 91  Temp(Src) 98.6 F (37 C) (Oral)  Resp 18  SpO2 99%  Physical Exam  Nursing note and vitals reviewed. Constitutional: He is oriented to person, place, and time. He appears well-developed and well-nourished. No distress.  HENT:  Head: Normocephalic and atraumatic.  Eyes: EOM are normal.  Neck: Neck supple. No tracheal deviation present.  Cardiovascular: Normal rate.   Pulmonary/Chest: Effort normal. No respiratory  distress.  Musculoskeletal: Normal range of motion.  Midline thoracic and lumbar spine tenderness, paravertebral thoracic and lumbar spine tenderness. Pain with forward flexion of the torso. No pain with bilateral straight leg raise  Neurological: He is alert and oriented to person, place, and time.  5/5 and equal lower extremity strength. 2+ and equal patellar reflexes bilaterally. Pt able to dorsiflex bilateral toes and feet with good strength against resistance. Equal sensation bilaterally over thighs and lower legs.   Skin: Skin is warm and dry.  Psychiatric: He has a normal mood and affect. His behavior is normal.    ED Course  Procedures (including critical care time)  DIAGNOSTIC STUDIES: Oxygen Saturation is 99% on room air, normal by my interpretation.    COORDINATION OF CARE: 11:33 PM -Will order x-ray. Recommend going to follow up with [facility]. Patient verbalizes understanding and agrees with treatment plan.    Labs Review Labs Reviewed - No data to display Imaging Review Dg Thoracic Spine 2 View  04/04/2013   CLINICAL DATA:  The back pain.  Lifting injury.  EXAM: THORACIC SPINE - 2 VIEW  COMPARISON:  None.  FINDINGS: There is no evidence of thoracic spine fracture. Alignment is normal. No other significant bone abnormalities are identified.  IMPRESSION: Negative.   Electronically Signed   By: Rolla Flatten M.D.   On: 04/04/2013 00:53   Dg Lumbar Spine Complete  04/04/2013   CLINICAL DATA:  Low back pain.  Lifting injury.  EXAM: LUMBAR SPINE - COMPLETE 4+ VIEW  COMPARISON:  None.  FINDINGS: There is no evidence of lumbar spine fracture. Alignment is normal. Intervertebral disc spaces are maintained.  IMPRESSION: Negative.   Electronically Signed   By: Rolla Flatten M.D.   On: 04/04/2013 00:53     EKG Interpretation None      MDM   Final diagnoses:  Lumbar strain  Pain in thoracic spine    Patient with back pain after lifting heavy objects at work. He still  able to work daily. He sent here by his job because his symptoms are not improving. He's currently taking Flexeril and Tylenol. X-rays were obtained and are negative. He's neurovascularly intact, no signs of cauda equina. Patient requesting pain medications. Will treat with Norco. He's instructed to followup with his spine Dr. He does have history of back injury and has received injections in the past. Patient agreeable with the  plan, will discharge home.  Filed Vitals:   04/03/13 2259  BP: 129/85  Pulse: 91  Temp: 98.6 F (37 C)  TempSrc: Oral  Resp: 18  SpO2: 99%    I personally performed the services described in this documentation, which was scribed in my presence. The recorded information has been reviewed and is accurate.    Renold Genta, PA-C 04/04/13 [phone]

## 2013-04-04 ENCOUNTER — Emergency Department (HOSPITAL_COMMUNITY): Payer: Worker's Compensation

## 2013-04-04 MED ORDER — HYDROCODONE-ACETAMINOPHEN 5-325 MG PO TABS: 1.0000 | ORAL_TABLET | Freq: Four times a day (QID) | ORAL | Status: DC | PRN

## 2013-04-04 MED ORDER — HYDROCODONE-ACETAMINOPHEN 5-325 MG PO TABS
1.0000 | ORAL_TABLET | Freq: Once | ORAL | Status: AC
  Administered 2013-04-04: 1 via ORAL
  Filled 2013-04-04: qty 1

## 2013-04-04 NOTE — Discharge Instructions (Signed)
Continue ibuprofen and flexeril. Take norco for severe pain. Your x-rays are normal. Follow up with primary care doctor and your back doctor for further evaluation.    Back Pain, Adult Low back pain is very common. About 1 in 5 people have back pain.The cause of low back pain is rarely dangerous. The pain often gets better over time.About half of people with a sudden onset of back pain feel better in just 2 weeks. About 8 in 10 people feel better by 6 weeks.  CAUSES Some common causes of back pain include:  Strain of the muscles or ligaments supporting the spine.  Wear and tear (degeneration) of the spinal discs.  Arthritis.  Direct injury to the back. DIAGNOSIS Most of the time, the direct cause of low back pain is not known.However, back pain can be treated effectively even when the exact cause of the pain is unknown.Answering your caregiver's questions about your overall health and symptoms is one of the most accurate ways to make sure the cause of your pain is not dangerous. If your caregiver needs more information, he or she may order lab work or imaging tests (X-rays or MRIs).However, even if imaging tests show changes in your back, this usually does not require surgery. HOME CARE INSTRUCTIONS For many people, back pain returns.Since low back pain is rarely dangerous, it is often a condition that people can learn to [facility] their own.   Remain active. It is stressful on the back to sit or stand in one place. Do not sit, drive, or stand in one place for more than 30 minutes at a time. Take short walks on level surfaces as soon as pain allows.Try to increase the length of time you walk each day.  Do not stay in bed.Resting more than 1 or 2 days can delay your recovery.  Do not avoid exercise or work.Your body is made to move.It is not dangerous to be active, even though your back may hurt.Your back will likely heal faster if you return to being active before your pain is  gone.  Pay attention to your body when you bend and lift. Many people have less discomfortwhen lifting if they bend their knees, keep the load close to their bodies,and avoid twisting. Often, the most comfortable positions are those that put less stress on your recovering back.  Find a comfortable position to sleep. Use a firm mattress and lie on your side with your knees slightly bent. If you lie on your back, put a pillow under your knees.  Only take over-the-counter or prescription medicines as directed by your caregiver. Over-the-counter medicines to reduce pain and inflammation are often the most helpful.Your caregiver may prescribe muscle relaxant drugs.These medicines help dull your pain so you can more quickly return to your normal activities and healthy exercise.  Put ice on the injured area.  Put ice in a plastic bag.  Place a towel between your skin and the bag.  Leave the ice on for 15-20 minutes, 03-04 times a day for the first 2 to 3 days. After that, ice and heat may be alternated to reduce pain and spasms.  Ask your caregiver about trying back exercises and gentle massage. This may be of some benefit.  Avoid feeling anxious or stressed.Stress increases muscle tension and can worsen back pain.It is important to recognize when you are anxious or stressed and learn ways to manage it.Exercise is a great option. SEEK MEDICAL CARE IF:  You have pain that is not  relieved with rest or medicine.  You have pain that does not improve in 1 week.  You have new symptoms.  You are generally not feeling well. SEEK IMMEDIATE MEDICAL CARE IF:   You have pain that radiates from your back into your legs.  You develop new bowel or bladder control problems.  You have unusual weakness or numbness in your arms or legs.  You develop nausea or vomiting.  You develop abdominal pain.  You feel faint. Document Released: 01/15/2005 Document Revised: 07/17/2011 Document Reviewed:  06/05/2010 [facility] Patient Information 2014 [place], [place].  Back Exercises Back exercises help treat and prevent back injuries. The goal is to increase your strength in your belly (abdominal) and back muscles. These exercises can also help with flexibility. Start these exercises when told by your doctor. HOME CARE Back exercises include: Pelvic Tilt.  Lie on your back with your knees bent. Tilt your pelvis until the lower part of your back is against the floor. Hold this position 5 to 10 sec. Repeat this exercise 5 to 10 times. Knee to Chest.  Pull 1 knee up against your chest and hold for 20 to 30 seconds. Repeat this with the other knee. This may be done with the other leg straight or bent, whichever feels better. Then, pull both knees up against your chest. Sit-Ups or Curl-Ups.  Bend your knees 90 degrees. Start with tilting your pelvis, and do a partial, slow sit-up. Only lift your upper half 30 to 45 degrees off the floor. Take at least 2 to 3 seonds for each sit-up. Do not do sit-ups with your knees out straight. If partial sit-ups are difficult, simply do the above but with only tightening your belly (abdominal) muscles and holding it as told. Hip-Lift.  Lie on your back with your knees flexed 90 degrees. Push down with your feet and shoulders as you raise your hips 2 inches off the floor. Hold for 10 seconds, repeat 5 to 10 times. Back Arches.  Lie on your stomach. Prop yourself up on bent elbows. Slowly press on your hands, causing an arch in your low back. Repeat 3 to 5 times. Shoulder-Lifts.  Lie face down with arms beside your body. Keep hips and belly pressed to floor as you slowly lift your head and shoulders off the floor. Do not overdo your exercises. Be careful in the beginning. Exercises may cause you some mild back discomfort. If the pain lasts for more than 15 minutes, stop the exercises until you see your doctor. Improvement with exercise for back problems is slow.    Document Released: 02/17/2010 Document Revised: 04/09/2011 Document Reviewed: 11/16/2010 [facility] Patient Information 2014 [place], [place].

## 2013-04-04 NOTE — ED Provider Notes (Signed)
Medical screening examination/treatment/procedure(s) were performed by non-physician practitioner and as supervising physician I was immediately available for consultation/collaboration.   EKG Interpretation None        Elyn Peers, MD 04/04/13 [phone]

## 2013-09-19 ENCOUNTER — Encounter (HOSPITAL_BASED_OUTPATIENT_CLINIC_OR_DEPARTMENT_OTHER): Payer: Self-pay | Admitting: Emergency Medicine

## 2013-09-19 ENCOUNTER — Emergency Department (HOSPITAL_BASED_OUTPATIENT_CLINIC_OR_DEPARTMENT_OTHER): Payer: BC Managed Care – PPO

## 2013-09-19 ENCOUNTER — Emergency Department (HOSPITAL_BASED_OUTPATIENT_CLINIC_OR_DEPARTMENT_OTHER)
Admission: EM | Admit: 2013-09-19 | Discharge: 2013-09-20 | Disposition: A | Discharge status: Home / Self Care | Payer: BC Managed Care – PPO | Attending: Emergency Medicine | Admitting: Emergency Medicine

## 2013-09-19 DIAGNOSIS — R1012 Left upper quadrant pain: Secondary | ICD-10-CM | POA: Insufficient documentation

## 2013-09-19 DIAGNOSIS — K589 Irritable bowel syndrome without diarrhea: Secondary | ICD-10-CM | POA: Insufficient documentation

## 2013-09-19 DIAGNOSIS — R1032 Left lower quadrant pain: Secondary | ICD-10-CM | POA: Diagnosis not present

## 2013-09-19 DIAGNOSIS — G8929 Other chronic pain: Secondary | ICD-10-CM | POA: Insufficient documentation

## 2013-09-19 DIAGNOSIS — Z8709 Personal history of other diseases of the respiratory system: Secondary | ICD-10-CM | POA: Insufficient documentation

## 2013-09-19 DIAGNOSIS — Z87891 Personal history of nicotine dependence: Secondary | ICD-10-CM | POA: Insufficient documentation

## 2013-09-19 DIAGNOSIS — R109 Unspecified abdominal pain: Secondary | ICD-10-CM | POA: Diagnosis present

## 2013-09-19 DIAGNOSIS — Z8739 Personal history of other diseases of the musculoskeletal system and connective tissue: Secondary | ICD-10-CM | POA: Insufficient documentation

## 2013-09-19 DIAGNOSIS — R000 Tachycardia, unspecified: Secondary | ICD-10-CM | POA: Insufficient documentation

## 2013-09-19 LAB — COMPREHENSIVE METABOLIC PANEL
ALK PHOS: 65 U/L (ref 39–117)
ALT: 28 U/L (ref 0–53)
ANION GAP: 15 (ref 5–15)
AST: 24 U/L (ref 0–37)
Albumin: 4.7 g/dL (ref 3.5–5.2)
BUN: 17 mg/dL (ref 6–23)
CO2: 25 mEq/L (ref 19–32)
Calcium: 10.2 mg/dL (ref 8.4–10.5)
Chloride: 97 mEq/L (ref 96–112)
Creatinine, Ser: 0.9 mg/dL (ref 0.50–1.35)
GFR calc non Af Amer: >90 mL/min (ref >90)
GLUCOSE: 99 mg/dL (ref 70–99)
POTASSIUM: 3.9 meq/L (ref 3.7–5.3)
Sodium: 137 mEq/L (ref 137–147)
Total Bilirubin: 0.7 mg/dL (ref 0.3–1.2)
Total Protein: 8.1 g/dL (ref 6.0–8.3)

## 2013-09-19 LAB — CBC WITH DIFFERENTIAL/PLATELET
Basophils Absolute: 0 10*3/uL (ref 0.0–0.1)
Basophils Relative: 0 % (ref 0–1)
EOS ABS: 0.1 10*3/uL (ref 0.0–0.7)
Eosinophils Relative: 2 % (ref 0–5)
HCT: 46.2 % (ref 39.0–52.0)
Hemoglobin: 16.2 g/dL (ref 13.0–17.0)
LYMPHS ABS: 0.8 10*3/uL (ref 0.7–4.0)
Lymphocytes Relative: 9 % — ABNORMAL LOW (ref 12–46)
MCH: 30.5 pg (ref 26.0–34.0)
MCHC: 35.1 g/dL (ref 30.0–36.0)
MCV: 86.8 fL (ref 78.0–100.0)
Monocytes Absolute: 1.1 10*3/uL — ABNORMAL HIGH (ref 0.1–1.0)
Monocytes Relative: 12 % (ref 3–12)
NEUTROS PCT: 78 % — AB (ref 43–77)
Neutro Abs: 7.2 10*3/uL (ref 1.7–7.7)
Platelets: 185 10*3/uL (ref 150–400)
RBC: 5.32 MIL/uL (ref 4.22–5.81)
RDW: 12.7 % (ref 11.5–15.5)
WBC: 9.3 10*3/uL (ref 4.0–10.5)

## 2013-09-19 LAB — URINALYSIS, ROUTINE W REFLEX MICROSCOPIC
BILIRUBIN URINE: NEGATIVE
Glucose, UA: NEGATIVE mg/dL
HGB URINE DIPSTICK: NEGATIVE
Ketones, ur: NEGATIVE mg/dL
Leukocytes, UA: NEGATIVE
NITRITE: NEGATIVE
PROTEIN: NEGATIVE mg/dL
Specific Gravity, Urine: 1.023 (ref 1.005–1.030)
UROBILINOGEN UA: 0.2 mg/dL (ref 0.0–1.0)
pH: 5.5 (ref 5.0–8.0)

## 2013-09-19 LAB — LIPASE, BLOOD: LIPASE: 27 U/L (ref 11–59)

## 2013-09-19 MED ORDER — PREDNISONE 20 MG PO TABS
20.0000 mg | ORAL_TABLET | ORAL | Status: AC
  Administered 2013-09-20: 20 mg via ORAL
  Filled 2013-09-19: qty 1

## 2013-09-19 MED ORDER — ACETAMINOPHEN 500 MG PO TABS
1000.0000 mg | ORAL_TABLET | Freq: Once | ORAL | Status: AC
  Administered 2013-09-19: 1000 mg via ORAL
  Filled 2013-09-19: qty 2

## 2013-09-19 MED ORDER — ONDANSETRON 8 MG PO TBDP: 8.0000 mg | ORAL_TABLET | Freq: Three times a day (TID) | ORAL | Status: DC | PRN

## 2013-09-19 MED ORDER — ONDANSETRON HCL 4 MG/2ML IJ SOLN
4.0000 mg | Freq: Once | INTRAMUSCULAR | Status: AC
  Administered 2013-09-19: 4 mg via INTRAVENOUS
  Filled 2013-09-19: qty 2

## 2013-09-19 MED ORDER — HYDROCODONE-ACETAMINOPHEN 5-325 MG PO TABS: 1.0000 | ORAL_TABLET | Freq: Four times a day (QID) | ORAL | Status: DC | PRN

## 2013-09-19 MED ORDER — SODIUM CHLORIDE 0.9 % IV SOLN
1000.0000 mL | Freq: Once | INTRAVENOUS | Status: AC
  Administered 2013-09-19: 1000 mL via INTRAVENOUS

## 2013-09-19 MED ORDER — PREDNISONE 10 MG PO TABS: 20.0000 mg | ORAL_TABLET | Freq: Every day | ORAL | Status: DC

## 2013-09-19 MED ORDER — HYDROMORPHONE HCL PF 1 MG/ML IJ SOLN
1.0000 mg | Freq: Once | INTRAMUSCULAR | Status: AC
  Administered 2013-09-19: 1 mg via INTRAVENOUS
  Filled 2013-09-19: qty 1

## 2013-09-19 MED ORDER — IOHEXOL 300 MG/ML  SOLN
100.0000 mL | Freq: Once | INTRAMUSCULAR | Status: AC | PRN
  Administered 2013-09-19: 100 mL via INTRAVENOUS

## 2013-09-19 MED ORDER — HYDROMORPHONE HCL PF 1 MG/ML IJ SOLN
0.5000 mg | Freq: Once | INTRAMUSCULAR | Status: AC
  Administered 2013-09-19: 0.5 mg via INTRAVENOUS
  Filled 2013-09-19: qty 1

## 2013-09-19 MED ORDER — IOHEXOL 300 MG/ML  SOLN
50.0000 mL | Freq: Once | INTRAMUSCULAR | Status: AC | PRN
  Administered 2013-09-19: 50 mL via ORAL

## 2013-09-19 NOTE — Discharge Instructions (Signed)
As discussed, it is important that you follow up as soon as possible with your physician for continued management of your condition. ° °If you develop any new, or concerning changes in your condition, please return to the emergency department immediately. ° ° °Abdominal Pain °Many things can cause abdominal pain. Usually, abdominal pain is not caused by a disease and will improve without treatment. It can often be observed and treated at home. Your health care provider will do a physical exam and possibly order blood tests and X-rays to help determine the seriousness of your pain. However, in many cases, more time must pass before a clear cause of the pain can be found. Before that point, your health care provider may not know if you need more testing or further treatment. °HOME CARE INSTRUCTIONS  °Monitor your abdominal pain for any changes. The following actions may help to alleviate any discomfort you are experiencing: °· Only take over-the-counter or prescription medicines as directed by your health care provider. °· Do not take laxatives unless directed to do so by your health care provider. °· Try a clear liquid diet (broth, tea, or water) as directed by your health care provider. Slowly move to a bland diet as tolerated. °SEEK MEDICAL CARE IF: °· You have unexplained abdominal pain. °· You have abdominal pain associated with nausea or diarrhea. °· You have pain when you urinate or have a bowel movement. °· You experience abdominal pain that wakes you in the night. °· You have abdominal pain that is worsened or improved by eating food. °· You have abdominal pain that is worsened with eating fatty foods. °· You have a fever. °SEEK IMMEDIATE MEDICAL CARE IF:  °· Your pain does not go away within 2 hours. °· You keep throwing up (vomiting). °· Your pain is felt only in portions of the abdomen, such as the right side or the left lower portion of the abdomen. °· You pass bloody or black tarry stools. °MAKE SURE  YOU: °· Understand these instructions.   °· Will watch your condition.   °· Will get help right away if you are not doing well or get worse.   °Document Released: 10/25/2004 Document Revised: 01/20/2013 Document Reviewed: 09/24/2012 °[employer]® Patient Information ©2015 [employer]. This information is not intended to replace advice given to you by your health care provider. Make sure you discuss any questions you have with your health care provider. ° °

## 2013-09-19 NOTE — ED Notes (Signed)
Pt reports abdominal pain, blood in stools and nausea. Reports history of IBS. Also sts fever.

## 2013-09-19 NOTE — ED Provider Notes (Signed)
CSN: [id number]     Arrival date & time 09/19/13  1803 History  This chart was scribed for Carmin Muskrat, MD by Steva Colder, ED Scribe. The patient was seen in room MH02/MH02 at 6:53 PM.     Chief Complaint  Patient presents with  . Abdominal Pain      The history is provided by the patient. No language interpreter was used.   HPI Comments: Benjamin Reed is a 28 y.o. male with a medical hx of IBS who presents to the Emergency Department complaining of abdominal pain onset today. He states that the pain is under his left ribs, left back, left hip, and left leg. He states that the last time that he had issues with IBS was 6 months ago. He states that he manages his IBS by eating healthy. He states that he is having associated symptoms of nausea, vomiting, diarrhea, fever. He states that he has not taken any pain medication for relief for his symptoms.   He denies dysuria, blood in urine, and any other associated symptoms. He states that he has had hemorrhoid surgery. He denies any abdomen surgery. 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 He states that his last CT scan was 6 months ago.   Past Medical History  Diagnosis Date  . IBS (irritable bowel syndrome)     no current meds.  . Chronic lower back pain     history of radiofrequency ablation  . Eosinophilic esophagitis   . 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 History of febrile seizure     as a child  . Nasal congestion 12/09/2012   Past Surgical History  Procedure Laterality Date  . Hemorrhoid surgery  04/12/2010    procedure for prolapse and hemorrhoids  . Knee arthroscopy Left 2013  . Knee arthroscopy Right 12/16/2012    Procedure: RIGHT ARTHROSCOPY KNEE;  Surgeon: Hessie Dibble, MD;  Location: [place];  Service: Orthopedics;  Laterality: Right;   Family History  Problem Relation Age of Onset  . Hyperlipidemia Mother     GPs  . Hypertension Mother     GPs  . Prostate  cancer Paternal Grandfather   . Colon cancer      GGM dx in her 55s to 49s  . Colon polyps      GF dx in his late 70s, aunt   History  Substance Use Topics  . Smoking status: Former Smoker    Quit date: 11/07/2012  . Smokeless tobacco: Current User  . Alcohol Use: Yes     Comment: occasionally    Review of Systems  Constitutional:       Per HPI, otherwise negative  HENT:       Per HPI, otherwise negative  Respiratory:       Per HPI, otherwise negative  Cardiovascular:       Per HPI, otherwise negative  Gastrointestinal: Positive for abdominal pain. Negative for vomiting.  Endocrine:       Negative aside from HPI  Genitourinary:       Neg aside from HPI   Musculoskeletal:       Per HPI, otherwise negative  Skin: Negative.   Neurological: Negative for syncope.      Allergies  Zoloft; Cephalexin; and Other  Home Medications   Prior to Admission medications   Medication Sig Start Date End Date Taking? Authorizing Provider  acetaminophen (TYLENOL) 500 MG tablet Take 1,000 mg by mouth every 6 (  six) hours as needed (pain).    Historical Provider, MD  calcium carbonate (TUMS - DOSED IN MG ELEMENTAL CALCIUM) 500 MG chewable tablet Chew 1 tablet by mouth daily as needed for indigestion or heartburn.    Historical Provider, MD  cyclobenzaprine (FLEXERIL) 10 MG tablet Take 10 mg by mouth 3 (three) times daily as needed for muscle spasms.    Historical Provider, MD  dicyclomine (BENTYL) 20 MG tablet Take 1 tablet (20 mg total) by mouth every 6 (six) hours as needed for spasms (for abdominal cramping). 03/12/13   Kalman Drape, MD  HYDROcodone-acetaminophen (NORCO) 5-325 MG per tablet Take 1 tablet by mouth every 6 (six) hours as needed. 04/04/13   Tatyana A Kirichenko, PA-C  ondansetron (ZOFRAN ODT) 8 MG disintegrating tablet Take 1 tablet (8 mg total) by mouth every 8 (eight) hours as needed for nausea or vomiting. 03/12/13   Kalman Drape, MD  promethazine (PHENERGAN) 25 MG tablet  Take 1 tablet (25 mg total) by mouth every 6 (six) hours as needed for nausea. 03/07/13   Domenic Moras, PA-C   BP 149/95  Pulse 125  Temp(Src) 99.5 F (37.5 C) (Oral)  Resp 22  Ht 5\' 10"  (1.778 m)  Wt 135 lb (61.236 kg)  BMI 19.37 kg/m2  SpO2 100%  Physical Exam  Nursing note and vitals reviewed. Constitutional: He is oriented to person, place, and time. He appears well-developed. No distress.  HENT:  Head: Normocephalic and atraumatic.  Eyes: Conjunctivae and EOM are normal.  Cardiovascular: Normal rate and regular rhythm.   tachycardic  Pulmonary/Chest: Effort normal. No stridor. No respiratory distress.  Abdominal: He exhibits no distension. There is tenderness.  LLQ and LUQ abdominal pain.   Musculoskeletal: He exhibits no edema.  Neurological: He is alert and oriented to person, place, and time.  Skin: Skin is warm and dry.  Warm to the touch  Psychiatric: He has a normal mood and affect.    ED Course  Procedures (including critical care time) DIAGNOSTIC STUDIES: Oxygen Saturation is 100% on room air, normal by my interpretation.    COORDINATION OF CARE: 7:00 PM-Discussed treatment plan which includes   with pt at bedside and pt agreed to plan.   Labs Review Labs Reviewed  CBC WITH DIFFERENTIAL - Abnormal; Notable for the following:    Neutrophils Relative % 78 (*)    Lymphocytes Relative 9 (*)    Monocytes Absolute 1.1 (*)    All other components within normal limits  COMPREHENSIVE METABOLIC PANEL  LIPASE, BLOOD  URINALYSIS, ROUTINE W REFLEX MICROSCOPIC    Imaging Review Ct Abdomen Pelvis W Contrast  09/19/2013   CLINICAL DATA:  Bloody stool. Left-sided abdominal pain. History of IBS  EXAM: CT ABDOMEN AND PELVIS WITH CONTRAST  TECHNIQUE: Multidetector CT imaging of the abdomen and pelvis was performed using the standard protocol following bolus administration of intravenous contrast.  CONTRAST:  176mL OMNIPAQUE IOHEXOL 300 MG/ML  SOLN  COMPARISON:  03/07/2013   FINDINGS: BODY WALL: Unremarkable.  LOWER CHEST: Unremarkable.  ABDOMEN/PELVIS:  Liver: No focal abnormality.  Biliary: No evidence of biliary obstruction or stone.  Pancreas: Unremarkable.  Spleen: Unremarkable.  Adrenals: Unremarkable.  Kidneys and ureters: No hydronephrosis or stone.  Bladder: Unremarkable.  Reproductive: Unremarkable.  Bowel: No obstruction. Normal appendix. Sutures noted at the level of the anal rectal junction.  Retroperitoneum: No mass or adenopathy.  Peritoneum: No ascites or pneumoperitoneum.  Vascular: No acute abnormality.  OSSEOUS: No acute abnormalities.  IMPRESSION:  No acute intra-abdominal findings.   Electronically Signed   By: Jorje Guild M.D.   On: 09/19/2013 21:41     EKG Interpretation None     11:43 PM Patient awake and alert, no active bleeding. Patient has some substantially reduced pain. We  MDM discussed return precautions, follow up instructions to   Patient presents with abdominal pain occasional blood in stool, nausea. Patient here is afebrile, and initially tachycardic, but this improves with fluids, analgesics. Patient has no anemia, and CT scan was largely reassuring.  She was discharged in stable condition after initiation of a short course of steroids, with gastroenterology followup.  I personally performed the services described in this documentation, which was scribed in my presence. The recorded information has been reviewed and is accurate.    Carmin Muskrat, MD 09/19/13 [phone]

## 2015-02-15 ENCOUNTER — Emergency Department (HOSPITAL_BASED_OUTPATIENT_CLINIC_OR_DEPARTMENT_OTHER): Payer: Self-pay

## 2015-02-15 ENCOUNTER — Encounter (HOSPITAL_BASED_OUTPATIENT_CLINIC_OR_DEPARTMENT_OTHER): Payer: Self-pay | Admitting: *Deleted

## 2015-02-15 ENCOUNTER — Emergency Department (HOSPITAL_BASED_OUTPATIENT_CLINIC_OR_DEPARTMENT_OTHER)
Admission: EM | Admit: 2015-02-15 | Discharge: 2015-02-15 | Disposition: A | Discharge status: Home / Self Care | Payer: Self-pay | Attending: Emergency Medicine | Admitting: Emergency Medicine

## 2015-02-15 DIAGNOSIS — Z8739 Personal history of other diseases of the musculoskeletal system and connective tissue: Secondary | ICD-10-CM | POA: Insufficient documentation

## 2015-02-15 DIAGNOSIS — Z7952 Long term (current) use of systemic steroids: Secondary | ICD-10-CM | POA: Insufficient documentation

## 2015-02-15 DIAGNOSIS — G8929 Other chronic pain: Secondary | ICD-10-CM | POA: Insufficient documentation

## 2015-02-15 DIAGNOSIS — Z8669 Personal history of other diseases of the nervous system and sense organs: Secondary | ICD-10-CM | POA: Insufficient documentation

## 2015-02-15 DIAGNOSIS — K589 Irritable bowel syndrome without diarrhea: Secondary | ICD-10-CM | POA: Insufficient documentation

## 2015-02-15 DIAGNOSIS — N451 Epididymitis: Secondary | ICD-10-CM | POA: Insufficient documentation

## 2015-02-15 DIAGNOSIS — Z87891 Personal history of nicotine dependence: Secondary | ICD-10-CM | POA: Insufficient documentation

## 2015-02-15 LAB — URINALYSIS, ROUTINE W REFLEX MICROSCOPIC
Bilirubin Urine: NEGATIVE
GLUCOSE, UA: NEGATIVE mg/dL
HGB URINE DIPSTICK: NEGATIVE
Ketones, ur: NEGATIVE mg/dL
LEUKOCYTES UA: NEGATIVE
Nitrite: NEGATIVE
PROTEIN: NEGATIVE mg/dL
Specific Gravity, Urine: 1.019 (ref 1.005–1.030)
pH: 6.5 (ref 5.0–8.0)

## 2015-02-15 MED ORDER — IBUPROFEN 800 MG PO TABS
800.0000 mg | ORAL_TABLET | Freq: Once | ORAL | Status: DC
  Filled 2015-02-15: qty 1

## 2015-02-15 MED ORDER — DOXYCYCLINE HYCLATE 100 MG PO CAPS: 100.0000 mg | ORAL_CAPSULE | Freq: Two times a day (BID) | ORAL | Status: DC

## 2015-02-15 MED ORDER — AZITHROMYCIN 1 G PO PACK
2.0000 g | PACK | Freq: Once | ORAL | Status: AC
  Administered 2015-02-15: 2 g via ORAL
  Filled 2015-02-15: qty 2

## 2015-02-15 MED ORDER — IBUPROFEN 800 MG PO TABS: 800.0000 mg | ORAL_TABLET | Freq: Three times a day (TID) | ORAL | Status: DC | PRN

## 2015-02-15 MED ORDER — AZITHROMYCIN 600 MG PO TABS
1800.0000 mg | ORAL_TABLET | Freq: Every day | ORAL | Status: DC
  Filled 2015-02-15: qty 3

## 2015-02-15 MED ORDER — TRAMADOL HCL 50 MG PO TABS: 50.0000 mg | ORAL_TABLET | Freq: Four times a day (QID) | ORAL | Status: DC | PRN

## 2015-02-15 MED ORDER — HYDROCODONE-ACETAMINOPHEN 5-325 MG PO TABS
1.0000 | ORAL_TABLET | Freq: Once | ORAL | Status: AC
  Administered 2015-02-15: 1 via ORAL
  Filled 2015-02-15: qty 1

## 2015-02-15 MED ORDER — AZITHROMYCIN 250 MG PO TABS: 250.0000 mg | ORAL_TABLET | Freq: Every day | ORAL | Status: DC

## 2015-02-15 MED ORDER — ONDANSETRON 8 MG PO TBDP
8.0000 mg | ORAL_TABLET | Freq: Once | ORAL | Status: AC
  Administered 2015-02-15: 8 mg via ORAL
  Filled 2015-02-15: qty 1

## 2015-02-15 NOTE — ED Notes (Signed)
Patient returned from Ultrasound. 

## 2015-02-15 NOTE — ED Provider Notes (Signed)
CSN: [id number]     Arrival date & time 02/15/15  1207 History   First MD Initiated Contact with Patient 02/15/15 1246     Chief Complaint  Patient presents with  . Testicle Pain     (Consider location/radiation/quality/duration/timing/severity/associated sxs/prior Treatment) HPI Patient presents to the emergency department with right testicle pain for the last 3 days.  He states that he has had a little increased frequency of urination along with some nausea.  The patient states that palpation makes the condition worse.  He states nothing seems make some condition better.  He is not taking any medications for the pain at home.  The patient denies weakness, dizziness, headache, blurred vision, back pain, incontinence, hematemesis, bloody stool, abdominal pain, chest pain, shortness of breath, diarrhea, fever, penile discharge or syncope.  The patient states that he also has had no rashes Past Medical History  Diagnosis Date  . IBS (irritable bowel syndrome)     no current meds.  . Chronic lower back pain     history of radiofrequency ablation  . Eosinophilic esophagitis   . 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 History of febrile seizure     as a child  . Nasal congestion 12/09/2012   Past Surgical History  Procedure Laterality Date  . Hemorrhoid surgery  04/12/2010    procedure for prolapse and hemorrhoids  . Knee arthroscopy Left 2013  . Knee arthroscopy Right 12/16/2012    Procedure: RIGHT ARTHROSCOPY KNEE;  Surgeon: Hessie Dibble, MD;  Location: [place];  Service: Orthopedics;  Laterality: Right;   Family History  Problem Relation Age of Onset  . Hyperlipidemia Mother     GPs  . Hypertension Mother     GPs  . Prostate cancer Paternal Grandfather   . Colon cancer      GGM dx in her 5s to 73s  . Colon polyps      GF dx in his late 58s, aunt   Social History  Substance Use Topics  . Smoking status: Former Smoker    Quit date: 11/07/2012  .  Smokeless tobacco: Current User  . Alcohol Use: Yes     Comment: occasionally    Review of Systems  All other systems negative except as documented in the HPI. All pertinent positives and negatives as reviewed in the HPI.  Allergies  Zoloft; Cephalexin; and Other  Home Medications   Prior to Admission medications   Medication Sig Start Date End Date Taking? Authorizing Provider  acetaminophen (TYLENOL) 500 MG tablet Take 1,000 mg by mouth every 6 (six) hours as needed (pain).    Historical Provider, MD  calcium carbonate (TUMS - DOSED IN MG ELEMENTAL CALCIUM) 500 MG chewable tablet Chew 1 tablet by mouth daily as needed for indigestion or heartburn.    Historical Provider, MD  cyclobenzaprine (FLEXERIL) 10 MG tablet Take 10 mg by mouth 3 (three) times daily as needed for muscle spasms.    Historical Provider, MD  dicyclomine (BENTYL) 20 MG tablet Take 1 tablet (20 mg total) by mouth every 6 (six) hours as needed for spasms (for abdominal cramping). 03/12/13   Linton Flemings, MD  HYDROcodone-acetaminophen (NORCO) 5-325 MG per tablet Take 1 tablet by mouth every 6 (six) hours as needed. 09/19/13   Carmin Muskrat, MD  ondansetron (ZOFRAN ODT) 8 MG disintegrating tablet Take 1 tablet (8 mg total) by mouth every 8 (eight) hours as needed for nausea or vomiting. 09/19/13   Carmin Muskrat, MD  predniSONE (DELTASONE) 10 MG tablet Take 2 tablets (20 mg total) by mouth daily. 09/19/13   Carmin Muskrat, MD  promethazine (PHENERGAN) 25 MG tablet Take 1 tablet (25 mg total) by mouth every 6 (six) hours as needed for nausea. 03/07/13   Domenic Moras, PA-C   BP 165/93 mmHg  Pulse 104  Temp(Src) 98.5 F (36.9 C) (Oral)  Resp 16  Ht 5\' 10"  (1.778 m)  Wt 70.308 kg  BMI 22.24 kg/m2  SpO2 100% Physical Exam  Constitutional: He is oriented to person, place, and time. He appears well-developed and well-nourished. No distress.  HENT:  Head: Normocephalic and atraumatic.  Mouth/Throat: Oropharynx is clear and  moist.  Eyes: Pupils are equal, round, and reactive to light.  Neck: Normal range of motion. Neck supple.  Cardiovascular: Normal rate, regular rhythm and normal heart sounds.  Exam reveals no gallop and no friction rub.   No murmur heard. Pulmonary/Chest: Effort normal and breath sounds normal. No respiratory distress. He has no wheezes.  Abdominal: Soft. Bowel sounds are normal. He exhibits no distension. There is no tenderness.  Genitourinary: Penis normal. Right testis shows tenderness. Right testis shows no mass and no swelling. Right testis is descended. Cremasteric reflex is not absent on the right side. Left testis shows no mass, no swelling and no tenderness. Left testis is descended. Cremasteric reflex is not absent on the left side. No penile tenderness. No discharge found.  Neurological: He is alert and oriented to person, place, and time. He exhibits normal muscle tone. Coordination normal.  Skin: Skin is warm and dry. No rash noted. No erythema.  Psychiatric: He has a normal mood and affect. His behavior is normal.  Nursing note and vitals reviewed.   ED Course  Procedures (including critical care time) Labs Review Labs Reviewed  URINALYSIS, ROUTINE W REFLEX MICROSCOPIC (NOT AT [facility])    Imaging Review US Scrotum  02/15/2015  CLINICAL DATA:  Right testicular pain for 3 days. No known injury. Initial encounter. EXAM: SCROTAL ULTRASOUND DOPPLER ULTRASOUND OF THE TESTICLES TECHNIQUE: Complete ultrasound examination of the testicles, epididymis, and other scrotal structures was performed. Color and spectral Doppler ultrasound were also utilized to evaluate blood flow to the testicles. COMPARISON:  None. FINDINGS: Right testicle Measurements: 4.7 x 2.4 x 2.7 cm. No mass or microlithiasis visualized. Left testicle Measurements: 4.5 x 2.5 x 2.6 cm. No mass or microlithiasis visualized. Right epididymis:  Normal in size and appearance. Left epididymis:  Normal in size and appearance.  Hydrocele:  None visualized. Varicocele:  None visualized. Pulsed Doppler interrogation of both testes demonstrates normal low resistance arterial and venous waveforms bilaterally. IMPRESSION: Normal examination. Electronically Signed   By: Inge Rise M.D.   On: 02/15/2015 13:40   [place] Art/ven Flow Abd Pelv Doppler  02/15/2015  CLINICAL DATA:  Right testicular pain for 3 days. No known injury. Initial encounter. EXAM: SCROTAL ULTRASOUND DOPPLER ULTRASOUND OF THE TESTICLES TECHNIQUE: Complete ultrasound examination of the testicles, epididymis, and other scrotal structures was performed. Color and spectral Doppler ultrasound were also utilized to evaluate blood flow to the testicles. COMPARISON:  None. FINDINGS: Right testicle Measurements: 4.7 x 2.4 x 2.7 cm. No mass or microlithiasis visualized. Left testicle Measurements: 4.5 x 2.5 x 2.6 cm. No mass or microlithiasis visualized. Right epididymis:  Normal in size and appearance. Left epididymis:  Normal in size and appearance. Hydrocele:  None visualized. Varicocele:  None visualized. Pulsed Doppler interrogation of both testes demonstrates normal low resistance arterial and venous  waveforms bilaterally. IMPRESSION: Normal examination. Electronically Signed   By: Inge Rise M.D.   On: 02/15/2015 13:40   I have personally reviewed and evaluated these images and lab results as part of my medical decision-making.   Patient be referred to urology.  I did treat him like this is a epididymitis with a STD component patient is advised to return here as needed.  Patient agrees the plan and all questions were answered  Dalia Heading, PA-C 02/15/15 2201  Julianne Rice, MD 02/18/15 [phone]

## 2015-02-15 NOTE — Discharge Instructions (Signed)
Return here as needed.  Follow-up with the urologist provided. 

## 2015-02-15 NOTE — ED Notes (Addendum)
Pain in his right testicle x 3 days. He is also urinating frequently and having a little nausea.

## 2015-02-15 NOTE — ED Notes (Signed)
Patient is alert and oriented x3.  He was given DC instructions and follow up visit instructions.  Patient gave verbal understanding.  He was DC ambulatory under his own power to home.  V/S stable.  He was not showing any signs of distress on DC 

## 2016-01-04 ENCOUNTER — Encounter (HOSPITAL_BASED_OUTPATIENT_CLINIC_OR_DEPARTMENT_OTHER): Payer: Self-pay | Admitting: Respiratory Therapy

## 2016-01-04 ENCOUNTER — Emergency Department (HOSPITAL_BASED_OUTPATIENT_CLINIC_OR_DEPARTMENT_OTHER): Payer: Self-pay

## 2016-01-04 ENCOUNTER — Emergency Department (HOSPITAL_BASED_OUTPATIENT_CLINIC_OR_DEPARTMENT_OTHER)
Admission: EM | Admit: 2016-01-04 | Discharge: 2016-01-04 | Disposition: A | Discharge status: Home / Self Care | Payer: Self-pay | Attending: Emergency Medicine | Admitting: Emergency Medicine

## 2016-01-04 DIAGNOSIS — Z87891 Personal history of nicotine dependence: Secondary | ICD-10-CM | POA: Insufficient documentation

## 2016-01-04 DIAGNOSIS — J40 Bronchitis, not specified as acute or chronic: Secondary | ICD-10-CM

## 2016-01-04 LAB — CBC
HEMATOCRIT: 43.7 % (ref 39.0–52.0)
Hemoglobin: 15.7 g/dL (ref 13.0–17.0)
MCH: 31 pg (ref 26.0–34.0)
MCHC: 35.9 g/dL (ref 30.0–36.0)
MCV: 86.2 fL (ref 78.0–100.0)
PLATELETS: 224 10*3/uL (ref 150–400)
RBC: 5.07 MIL/uL (ref 4.22–5.81)
RDW: 12.3 % (ref 11.5–15.5)
WBC: 6.4 10*3/uL (ref 4.0–10.5)

## 2016-01-04 LAB — COMPREHENSIVE METABOLIC PANEL
ALT: 24 U/L (ref 17–63)
AST: 24 U/L (ref 15–41)
Albumin: 4.8 g/dL (ref 3.5–5.0)
Alkaline Phosphatase: 46 U/L (ref 38–126)
Anion gap: 8 (ref 5–15)
BILIRUBIN TOTAL: 0.8 mg/dL (ref 0.3–1.2)
BUN: 16 mg/dL (ref 6–20)
CALCIUM: 9.8 mg/dL (ref 8.9–10.3)
CO2: 27 mmol/L (ref 22–32)
CREATININE: 0.82 mg/dL (ref 0.61–1.24)
Chloride: 103 mmol/L (ref 101–111)
Glucose, Bld: 96 mg/dL (ref 65–99)
Potassium: 4.1 mmol/L (ref 3.5–5.1)
Sodium: 138 mmol/L (ref 135–145)
TOTAL PROTEIN: 7.5 g/dL (ref 6.5–8.1)

## 2016-01-04 LAB — D-DIMER, QUANTITATIVE (NOT AT ARMC): D DIMER QUANT: 0.46 ug{FEU}/mL (ref 0.00–0.50)

## 2016-01-04 LAB — TROPONIN I

## 2016-01-04 MED ORDER — SODIUM CHLORIDE 0.9 % IV BOLUS (SEPSIS)
1000.0000 mL | Freq: Once | INTRAVENOUS | Status: AC
  Administered 2016-01-04: 1000 mL via INTRAVENOUS

## 2016-01-04 MED ORDER — MORPHINE SULFATE (PF) 4 MG/ML IV SOLN
4.0000 mg | Freq: Once | INTRAVENOUS | Status: AC
  Administered 2016-01-04: 4 mg via INTRAVENOUS
  Filled 2016-01-04: qty 1

## 2016-01-04 MED ORDER — ALBUTEROL SULFATE HFA 108 (90 BASE) MCG/ACT IN AERS
1.0000 | INHALATION_SPRAY | Freq: Four times a day (QID) | RESPIRATORY_TRACT | 0 refills | Status: DC | PRN
Start: 2016-01-04 — End: 2020-05-05

## 2016-01-04 MED FILL — PROVENTIL HFA 90 MCG INH: 108 (90 BAS | 30 days supply | Qty: 7 | Fill #0

## 2016-01-04 NOTE — Discharge Instructions (Signed)
Please read and follow all provided instructions.  Your diagnoses today include:  1. Bronchitis    Tests performed today include: An EKG of your heart A chest x-ray Cardiac enzymes - a blood test for heart muscle damage Blood counts and electrolytes Vital signs. See below for your results today.   Medications prescribed:   Take any prescribed medications only as directed.  Follow-up instructions: Please follow-up with your primary care provider as soon as you can for further evaluation of your symptoms.   Return instructions:  SEEK IMMEDIATE MEDICAL ATTENTION IF: You have severe chest pain, especially if the pain is crushing or pressure-like and spreads to the arms, back, neck, or jaw, or if you have sweating, nausea (feeling sick to your stomach), or shortness of breath. THIS IS AN EMERGENCY. Don't wait to see if the pain will go away. Get medical help at once. Call 911 or 0 (operator). DO NOT drive yourself to the hospital.  Your chest pain gets worse and does not go away with rest.  You have an attack of chest pain lasting longer than usual, despite rest and treatment with the medications your caregiver has prescribed.  You wake from sleep with chest pain or shortness of breath. You feel dizzy or faint. You have chest pain not typical of your usual pain for which you originally saw your caregiver.  You have any other emergent concerns regarding your health.  Additional Information: Chest pain comes from many different causes. Your caregiver has diagnosed you as having chest pain that is not specific for one problem, but does not require admission.  You are at low risk for an acute heart condition or other serious illness.   Your vital signs today were: BP 138/96 (BP Location: Right Arm)    Pulse 100    Temp 98.7 F (37.1 C) (Oral)    Resp 16    Ht 5\' 10"  (1.778 m)    Wt 72.6 kg    SpO2 100%    BMI 22.96 kg/m  If your blood pressure (BP) was elevated above 135/85 this visit,  please have this repeated by your doctor within one month. --------------

## 2016-01-04 NOTE — ED Provider Notes (Signed)
[facility] Provider Note   CSN: [id number] Arrival date & time: 01/04/16  [id number]     History   Chief Complaint Chief Complaint  Patient presents with  . Cough    HPI Benjamin Reed is a 30 y.o. male.  HPI  30 y.o. male, presents to the Emergency Department today complaining of cough, chest pain, shortness of breath x 1.5 months. Pt notes hemoptysis during the duration of time that streaks in phlegm that has worsened. Notes chest pain/shortness of breath occur randomly without exacerbating factors. Rates pain 5/10. Notes bouts last for 2 min. Has associated dizziness and headaches occasionally. Has not tried any OTC relief. Notes nausea without emesis. No diaphoresis. No fevers. No ABD pain. Notes smoking history of 3 to 10 cigarettes a day since he was 30 years old. No hx ACS. No FH. No hx DVT/PE. No recent surgeries. No recent travel. Does note FH of Factor V Leiden with no official diagnosis for himself. No other symptoms noted    Past Medical History:  Diagnosis Date  . 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 Chronic lower back pain    history of radiofrequency ablation  . Eosinophilic esophagitis   . History of febrile seizure    as a child  . IBS (irritable bowel syndrome)    no current meds.  . Nasal congestion 12/09/2012    Patient Active Problem List   Diagnosis Date Noted  . NECK PAIN 03/21/2010  . CHEST PAIN 03/21/2010  . ESOPHAGITIS 02/01/2010  . GI BLEEDING 02/01/2010  . ELEVATED BLOOD PRESSURE WITHOUT DIAGNOSIS OF HYPERTENSION 02/01/2010    Past Surgical History:  Procedure Laterality Date  . HEMORRHOID SURGERY  04/12/2010   procedure for prolapse and hemorrhoids  . KNEE ARTHROSCOPY Left 2013  . KNEE ARTHROSCOPY Right 12/16/2012   Procedure: RIGHT ARTHROSCOPY KNEE;  Surgeon: Hessie Dibble, MD;  Location: [facility];  Service: Orthopedics;  Laterality: Right;       Home Medications    Prior to Admission medications     Medication Sig Start Date End Date Taking? Authorizing Provider  acetaminophen (TYLENOL) 500 MG tablet Take 1,000 mg by mouth every 6 (six) hours as needed (pain).   Yes Historical Provider, MD    Family History Family History  Problem Relation Age of Onset  . Hyperlipidemia Mother     GPs  . Hypertension Mother     GPs  . Prostate cancer Paternal Grandfather   . Colon cancer      GGM dx in her 58s to 72s  . Colon polyps      GF dx in his late 73s, aunt    Social History Social History  Substance Use Topics  . Smoking status: Former Smoker    Packs/day: 0.50    Types: Cigarettes    Quit date: 11/07/2012  . Smokeless tobacco: Current User  . Alcohol use 3.6 - 6.0 oz/week    6 - 10 Cans of beer per week     Comment: 2-3 times a week.     Allergies   Zoloft [sertraline hcl]; Cephalexin; and Other   Review of Systems Review of Systems ROS reviewed and all are negative for acute change except as noted in the HPI.  Physical Exam Updated Vital Signs Ht 5\' 10"  (1.778 m)   Wt 72.6 kg   BMI 22.96 kg/m   Physical Exam  Constitutional: He is oriented to person, place, and time. Vital signs are normal.  He appears well-developed and well-nourished.  NAD. Resting Comfortably. Appears Anxious   HENT:  Head: Normocephalic and atraumatic.  Right Ear: Hearing normal.  Left Ear: Hearing normal.  Eyes: Conjunctivae and EOM are normal. Pupils are equal, round, and reactive to light.  Neck: Normal range of motion. Neck supple.  Cardiovascular: Regular rhythm, normal heart sounds and intact distal pulses.  Tachycardia present.   Pulmonary/Chest: Effort normal and breath sounds normal.  Abdominal: Soft.  Musculoskeletal: Normal range of motion.  Neurological: He is alert and oriented to person, place, and time.  Skin: Skin is warm and dry.  Psychiatric: He has a normal mood and affect. His speech is normal and behavior is normal. Thought content normal.  Nursing note and  vitals reviewed.  ED Treatments / Results  Labs (all labs ordered are listed, but only abnormal results are displayed) Labs Reviewed  CBC  COMPREHENSIVE METABOLIC PANEL  TROPONIN I  D-DIMER, QUANTITATIVE (NOT AT [facility])    EKG  EKG Interpretation  Date/Time:  Wednesday January 04 2016 09:49:14 EST Ventricular Rate:  98 PR Interval:    QRS Duration: 83 QT Interval:  329 QTC Calculation: 420 R Axis:   70 Text Interpretation:  Sinus rhythm Borderline short PR interval No previous ECGs available Confirmed by Rogene Houston  MD, SCOTT [phone]) on 01/04/2016 9:58:30 AM Also confirmed by Rogene Houston  MD, SCOTT [phone]), editor [place] CT, Leda Gauze [phone])  on 01/04/2016 10:01:10 AM       Radiology Dg Chest 2 View  Result Date: 01/04/2016 CLINICAL DATA:  Launch cysts for the past 6 weeks. Left-sided chest pain radiating into the left shoulder, arm and neck. Daily headache. The patient reports chest heaviness today. Reportedly discontinued smoking in 2014. EXAM: CHEST  2 VIEW COMPARISON:  Chest x-ray of March 21, 2010 FINDINGS: The lungs are mildly hyperinflated with hemidiaphragm flattening. There is no focal infiltrate. There is no pleural effusion. The heart and pulmonary vascularity are normal. The mediastinum is normal in width. The bony thorax is unremarkable. IMPRESSION: Mild hyperinflation suggests reactive airway disease. No pneumonia nor other acute cardiopulmonary abnormality. Electronically Signed   By: David  Martinique M.D.   On: 01/04/2016 10:37    Procedures Procedures (including critical care time)  Medications Ordered in ED Medications - No data to display   Initial Impression / Assessment and Plan / ED Course  I have reviewed the triage vital signs and the nursing notes.  Pertinent labs & imaging results that were available during my care of the patient were reviewed by me and considered in my medical decision making (see chart for details).  Clinical Course    Final Clinical  Impressions(s) / ED Diagnoses  {I have reviewed and evaluated the relevant laboratory values. {I have reviewed and evaluated the relevant imaging studies. {I have interpreted the relevant EKG. {I have reviewed the relevant previous healthcare records.  {I obtained HPI from historian. {Patient discussed with supervising physician.  ED Course:  Assessment: Pt is a 30yM presents with CP x 1.5 months as well as hemoptysis. Intermittent. No exacerbating factors. Risk Factors None. Given analgesia in ED. Chest pain is not likely of cardiac or pulmonary etiology d/t presentation, perc negative, VSS, no tracheal deviation, no JVD or new murmur, RRR, breath sounds equal bilaterally, EKG without acute abnormalities, negative troponin, and negative CXR. Heart Score 1. Due to [place] Factor V Leiden and Hemoptysis, a D Dimer was ordered, which was negative.  CP and Hemoptysis likely related to smoking history. Possible  Chronic Bronchitis. Shows on CXR. Pt has been advised to return to the ED is CP becomes exertional, associated with diaphoresis or nausea, radiates to left jaw/arm, worsens or becomes concerning in any way. Pt appears reliable for follow up and is agreeable to discharge. Patient is in no acute distress. Vital Signs are stable. Patient is able to ambulate. Patient able to tolerate PO.   Disposition/Plan:  DC Home Additional Verbal discharge instructions given and discussed with patient.  Pt Instructed to f/u with PCP in the next week for evaluation and treatment of symptoms. Return precautions given Pt acknowledges and agrees with plan  Supervising Physician Fredia Sorrow, MD  Final diagnoses:  Bronchitis    New Prescriptions New Prescriptions   No medications on file     Shary Decamp, PA-C 01/04/16 [place], MD 01/04/16 1210

## 2016-01-04 NOTE — ED Triage Notes (Signed)
C/o coughing up blood x 1.5 months. Also c/o h/a and mid to left chest pain into left neck and left shoulder. Has had sob and dizziness.

## 2016-02-08 ENCOUNTER — Emergency Department (HOSPITAL_BASED_OUTPATIENT_CLINIC_OR_DEPARTMENT_OTHER)
Admission: EM | Admit: 2016-02-08 | Discharge: 2016-02-08 | Disposition: A | Discharge status: Home / Self Care | Payer: Self-pay | Attending: Emergency Medicine | Admitting: Emergency Medicine

## 2016-02-08 ENCOUNTER — Encounter (HOSPITAL_BASED_OUTPATIENT_CLINIC_OR_DEPARTMENT_OTHER): Payer: Self-pay

## 2016-02-08 ENCOUNTER — Emergency Department (HOSPITAL_BASED_OUTPATIENT_CLINIC_OR_DEPARTMENT_OTHER): Payer: Self-pay

## 2016-02-08 DIAGNOSIS — R1110 Vomiting, unspecified: Secondary | ICD-10-CM

## 2016-02-08 DIAGNOSIS — F17200 Nicotine dependence, unspecified, uncomplicated: Secondary | ICD-10-CM | POA: Insufficient documentation

## 2016-02-08 DIAGNOSIS — R1111 Vomiting without nausea: Secondary | ICD-10-CM | POA: Insufficient documentation

## 2016-02-08 DIAGNOSIS — R51 Headache: Secondary | ICD-10-CM | POA: Insufficient documentation

## 2016-02-08 DIAGNOSIS — Z79899 Other long term (current) drug therapy: Secondary | ICD-10-CM | POA: Insufficient documentation

## 2016-02-08 HISTORY — DX: Heartburn: R12

## 2016-02-08 LAB — CBC WITH DIFFERENTIAL/PLATELET
BASOS ABS: 0.1 10*3/uL (ref 0.0–0.1)
BASOS PCT: 1 %
EOS ABS: 0.1 10*3/uL (ref 0.0–0.7)
Eosinophils Relative: 3 %
HEMATOCRIT: 45.1 % (ref 39.0–52.0)
HEMOGLOBIN: 15.6 g/dL (ref 13.0–17.0)
Lymphocytes Relative: 21 %
Lymphs Abs: 1.2 10*3/uL (ref 0.7–4.0)
MCH: 29.9 pg (ref 26.0–34.0)
MCHC: 34.6 g/dL (ref 30.0–36.0)
MCV: 86.6 fL (ref 78.0–100.0)
MONOS PCT: 14 %
Monocytes Absolute: 0.8 10*3/uL (ref 0.1–1.0)
NEUTROS ABS: 3.4 10*3/uL (ref 1.7–7.7)
NEUTROS PCT: 61 %
Platelets: 207 10*3/uL (ref 150–400)
RBC: 5.21 MIL/uL (ref 4.22–5.81)
RDW: 12.2 % (ref 11.5–15.5)
WBC: 5.6 10*3/uL (ref 4.0–10.5)

## 2016-02-08 LAB — COMPREHENSIVE METABOLIC PANEL
ALK PHOS: 48 U/L (ref 38–126)
ALT: 25 U/L (ref 17–63)
AST: 24 U/L (ref 15–41)
Albumin: 5.1 g/dL — ABNORMAL HIGH (ref 3.5–5.0)
Anion gap: 9 (ref 5–15)
BILIRUBIN TOTAL: 0.7 mg/dL (ref 0.3–1.2)
BUN: 12 mg/dL (ref 6–20)
CALCIUM: 9.7 mg/dL (ref 8.9–10.3)
CO2: 25 mmol/L (ref 22–32)
Chloride: 102 mmol/L (ref 101–111)
Creatinine, Ser: 0.96 mg/dL (ref 0.61–1.24)
GFR calc Af Amer: >60 mL/min (ref >60)
GFR calc non Af Amer: >60 mL/min (ref >60)
Glucose, Bld: 108 mg/dL — ABNORMAL HIGH (ref 65–99)
Potassium: 3.6 mmol/L (ref 3.5–5.1)
SODIUM: 136 mmol/L (ref 135–145)
TOTAL PROTEIN: 8.3 g/dL — AB (ref 6.5–8.1)

## 2016-02-08 LAB — LIPASE, BLOOD: Lipase: 24 U/L (ref 11–51)

## 2016-02-08 MED ORDER — PANTOPRAZOLE SODIUM 40 MG IV SOLR
40.0000 mg | Freq: Once | INTRAVENOUS | Status: AC
  Administered 2016-02-08: 40 mg via INTRAVENOUS
  Filled 2016-02-08: qty 40

## 2016-02-08 MED ORDER — DIPHENHYDRAMINE HCL 50 MG/ML IJ SOLN
25.0000 mg | Freq: Once | INTRAMUSCULAR | Status: AC
  Administered 2016-02-08: 25 mg via INTRAVENOUS
  Filled 2016-02-08: qty 1

## 2016-02-08 MED ORDER — FAMOTIDINE IN NACL 20-0.9 MG/50ML-% IV SOLN
20.0000 mg | Freq: Once | INTRAVENOUS | Status: AC
  Administered 2016-02-08: 20 mg via INTRAVENOUS
  Filled 2016-02-08: qty 50

## 2016-02-08 MED ORDER — FAMOTIDINE 20 MG PO TABS: 20.0000 mg | ORAL_TABLET | Freq: Two times a day (BID) | ORAL | 1 refills | Status: DC

## 2016-02-08 MED ORDER — PANTOPRAZOLE SODIUM 20 MG PO TBEC: 20.0000 mg | DELAYED_RELEASE_TABLET | Freq: Every day | ORAL | 1 refills | Status: DC

## 2016-02-08 MED ORDER — SODIUM CHLORIDE 0.9 % IV BOLUS (SEPSIS)
1000.0000 mL | Freq: Once | INTRAVENOUS | Status: AC
  Administered 2016-02-08: 1000 mL via INTRAVENOUS

## 2016-02-08 MED ORDER — METOCLOPRAMIDE HCL 5 MG/ML IJ SOLN
10.0000 mg | Freq: Once | INTRAMUSCULAR | Status: AC
  Administered 2016-02-08: 10 mg via INTRAVENOUS
  Filled 2016-02-08: qty 2

## 2016-02-08 MED ORDER — ONDANSETRON HCL 4 MG PO TABS: 4.0000 mg | ORAL_TABLET | Freq: Three times a day (TID) | ORAL | 0 refills | Status: DC | PRN

## 2016-02-08 MED ORDER — ONDANSETRON HCL 4 MG/2ML IJ SOLN
4.0000 mg | Freq: Once | INTRAMUSCULAR | Status: AC
  Administered 2016-02-08: 4 mg via INTRAVENOUS
  Filled 2016-02-08: qty 2

## 2016-02-08 MED ORDER — ACETAMINOPHEN 325 MG PO TABS
650.0000 mg | ORAL_TABLET | Freq: Once | ORAL | Status: AC
  Administered 2016-02-08: 650 mg via ORAL
  Filled 2016-02-08: qty 2

## 2016-02-08 NOTE — ED Triage Notes (Signed)
C/o vomiting blood 12/25-again 4 days and again today-HA daily x 2-3 weeks-NAD-pale-taken to tx in w/c

## 2016-02-08 NOTE — ED Notes (Addendum)
Rx x 3 given for protonix, pepcid, and zofran. Pt d/c home with family

## 2016-02-08 NOTE — ED Provider Notes (Signed)
[facility] DEPT [facility] Provider Note   CSN: [id number] Arrival date & time: 02/08/16  1547     History   Chief Complaint Chief Complaint  Patient presents with  . Hematemesis    HPI Benjamin Reed is a 31 y.o. male.  31 year old male with a past medical history of eosinophilic esophagitis, herbal bowel syndrome, and reflux presents to the emergency department today secondary to vomiting blood. Also has had a headache. Patient states that for some rebound blood was on Christmas day. He vomited once and then the second time he vomited had small amount of blood in it. After 15 minutes his symptoms resolved and he felt well. He is doing fine until a week ago today after drinking quite a bit of alcohol he had another episode of blood streaked sputum that self resolved as well. And today he had multiple episodes of blood-streaked sputum so came here for evaluation. No real abdominal pain outside of vomiting. No fevers, passing out, chest pain or shortness breath. No other associated modifying symptoms. Patient also states he's had a headache for approximately 3 weeks. This is different than normal headache she has little bit worsening blurred vision in his left eye and pain behind that eye as well. No neurologic changes. No other associated symptoms.      Past Medical History:  Diagnosis Date  . 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 Chronic lower back pain    history of radiofrequency ablation  . Eosinophilic esophagitis   . Heartburn   . History of febrile seizure    as a child  . IBS (irritable bowel syndrome)    no current meds.  . Nasal congestion 12/09/2012    Patient Active Problem List   Diagnosis Date Noted  . NECK PAIN 03/21/2010  . CHEST PAIN 03/21/2010  . ESOPHAGITIS 02/01/2010  . GI BLEEDING 02/01/2010  . ELEVATED BLOOD PRESSURE WITHOUT DIAGNOSIS OF HYPERTENSION 02/01/2010    Past Surgical History:  Procedure Laterality Date  . HEMORRHOID SURGERY   04/12/2010   procedure for prolapse and hemorrhoids  . KNEE ARTHROSCOPY Left 2013  . KNEE ARTHROSCOPY Right 12/16/2012   Procedure: RIGHT ARTHROSCOPY KNEE;  Surgeon: Hessie Dibble, MD;  Location: [facility];  Service: Orthopedics;  Laterality: Right;       Home Medications    Prior to Admission medications   Medication Sig Start Date End Date Taking? Authorizing Provider  acetaminophen (TYLENOL) 500 MG tablet Take 1,000 mg by mouth every 6 (six) hours as needed (pain).    Historical Provider, MD  albuterol (PROVENTIL HFA;VENTOLIN HFA) 108 (90 Base) MCG/ACT inhaler Inhale 1-2 puffs into the lungs every 6 (six) hours as needed for wheezing or shortness of breath. 01/04/16   Shary Decamp, PA-C  famotidine (PEPCID) 20 MG tablet Take 1 tablet (20 mg total) by mouth 2 (two) times daily. 02/08/16   Merrily Pew, MD  ondansetron (ZOFRAN) 4 MG tablet Take 1 tablet (4 mg total) by mouth every 8 (eight) hours as needed for nausea or vomiting. 02/08/16   Merrily Pew, MD  pantoprazole (PROTONIX) 20 MG tablet Take 1 tablet (20 mg total) by mouth daily. 02/08/16   Merrily Pew, MD    Family History Family History  Problem Relation Age of Onset  . Hyperlipidemia Mother     GPs  . Hypertension Mother     GPs  . Prostate cancer Paternal Grandfather   . Colon cancer      GGM dx in  her 39s to 53s  . Colon polyps      GF dx in his late 76s, aunt    Social History Social History  Substance Use Topics  . Smoking status: Former Smoker    Packs/day: 0.50    Types: Cigarettes    Quit date: 11/07/2012  . Smokeless tobacco: Current User  . Alcohol use Yes     Comment: weekly prior to c/o x today     Allergies   Zoloft [sertraline hcl]; Cephalexin; Other; and Nsaids   Review of Systems Review of Systems  All other systems reviewed and are negative.    Physical Exam Updated Vital Signs BP 125/89 (BP Location: Left Arm)   Pulse 109   Temp 98.1 F (36.7 C) (Oral)   Resp 20    Ht 5\' 8"  (1.727 m)   Wt 150 lb (68 kg)   SpO2 100%   BMI 22.81 kg/m   Physical Exam  Constitutional: He is oriented to person, place, and time. He appears well-developed and well-nourished.  HENT:  Head: Normocephalic and atraumatic.  Eyes: Conjunctivae and EOM are normal.  Neck: Normal range of motion.  Cardiovascular: Normal rate.   Pulmonary/Chest: Effort normal. No respiratory distress.  Abdominal: He exhibits no distension.  Musculoskeletal: Normal range of motion. He exhibits no edema or deformity.  Neurological: He is alert and oriented to person, place, and time.  No altered mental status, able to give full seemingly accurate history.  Face is symmetric, EOM's intact, pupils equal and reactive, vision intact, tongue and uvula midline without deviation Upper and Lower extremity motor 5/5, intact pain perception in distal extremities, 2+ reflexes in biceps, patella and achilles tendons. Finger to nose normal, heel to shin normal. Walks without assistance or evident ataxia.   Nursing note and vitals reviewed.    ED Treatments / Results  Labs (all labs ordered are listed, but only abnormal results are displayed) Labs Reviewed  COMPREHENSIVE METABOLIC PANEL - Abnormal; Notable for the following:       Result Value   Glucose, Bld 108 (*)    Total Protein 8.3 (*)    Albumin 5.1 (*)    All other components within normal limits  CBC WITH DIFFERENTIAL/PLATELET  LIPASE, BLOOD    EKG  EKG Interpretation None       Radiology Ct Head Wo Contrast  Result Date: 02/08/2016 CLINICAL DATA:  31 y/o M; 3 weeks of headache. History of febrile seizure. EXAM: CT HEAD WITHOUT CONTRAST TECHNIQUE: Contiguous axial images were obtained from the base of the skull through the vertex without intravenous contrast. COMPARISON:  None. FINDINGS: Brain: No evidence of acute infarction, hemorrhage, hydrocephalus, extra-axial collection or mass lesion/mass effect. Vascular: No hyperdense vessel  or unexpected calcification. Skull: Normal. Negative for fracture or focal lesion. Sinuses/Orbits: No acute finding. Other: None. IMPRESSION: Normal CT of the head. Electronically Signed   By: Kristine Garbe M.D.   On: 02/08/2016 19:21    Procedures Procedures (including critical care time)  Medications Ordered in ED Medications  sodium chloride 0.9 % bolus 1,000 mL (0 mLs Intravenous Stopped 02/08/16 1657)  pantoprazole (PROTONIX) injection 40 mg (40 mg Intravenous Given 02/08/16 1700)  famotidine (PEPCID) IVPB 20 mg premix (0 mg Intravenous Stopped 02/08/16 1729)  ondansetron (ZOFRAN) injection 4 mg (4 mg Intravenous Given 02/08/16 1700)  sodium chloride 0.9 % bolus 1,000 mL (0 mLs Intravenous Stopped 02/08/16 1744)  acetaminophen (TYLENOL) tablet 650 mg (650 mg Oral Given 02/08/16 1728)  metoCLOPramide (  REGLAN) injection 10 mg (10 mg Intravenous Given 02/08/16 1824)  diphenhydrAMINE (BENADRYL) injection 25 mg (25 mg Intravenous Given 02/08/16 1822)     Initial Impression / Assessment and Plan / ED Course  I have reviewed the triage vital signs and the nursing notes.  Pertinent labs & imaging results that were available during my care of the patient were reviewed by me and considered in my medical decision making (see chart for details).  Clinical Course    Suspect Esophagitis versus peptic ulcer disease versus gastritis. We'll treat with Protonix and Pepcid for now. We'll also get a visual screening to think is I pain is likely secondary to vision change. However if this is normal, he has a strong family history for different cancers so we'll get a CT scan of his head. Workup negative. Improved vitals (ongoing vitals says >100 however on my exam it was 88).  Will follow upw ith PCP for further workup of symptoms, will hold on spicy foods, greasy foods alcohol and tobacco. We'll start Protonix and Zantac to see gets improvement from those.  Final Clinical Impressions(s) / ED  Diagnoses   Final diagnoses:  Vomiting, intractability of vomiting not specified, presence of nausea not specified, unspecified vomiting type    New Prescriptions Discharge Medication List as of 02/08/2016  7:26 PM    START taking these medications   Details  ondansetron (ZOFRAN) 4 MG tablet Take 1 tablet (4 mg total) by mouth every 8 (eight) hours as needed for nausea or vomiting., Starting Wed 02/08/2016, Print    pantoprazole (PROTONIX) 20 MG tablet Take 1 tablet (20 mg total) by mouth daily., Starting Wed 02/08/2016, Print         Merrily Pew, MD 02/09/16 0030

## 2017-03-14 ENCOUNTER — Emergency Department (HOSPITAL_BASED_OUTPATIENT_CLINIC_OR_DEPARTMENT_OTHER)
Admission: EM | Admit: 2017-03-14 | Discharge: 2017-03-14 | Disposition: A | Discharge status: Home / Self Care | Payer: Self-pay | Attending: Emergency Medicine | Admitting: Emergency Medicine

## 2017-03-14 ENCOUNTER — Emergency Department (HOSPITAL_BASED_OUTPATIENT_CLINIC_OR_DEPARTMENT_OTHER): Payer: Self-pay

## 2017-03-14 ENCOUNTER — Other Ambulatory Visit: Payer: Self-pay

## 2017-03-14 ENCOUNTER — Encounter (HOSPITAL_BASED_OUTPATIENT_CLINIC_OR_DEPARTMENT_OTHER): Payer: Self-pay | Admitting: Emergency Medicine

## 2017-03-14 DIAGNOSIS — Z79899 Other long term (current) drug therapy: Secondary | ICD-10-CM | POA: Insufficient documentation

## 2017-03-14 DIAGNOSIS — Z87891 Personal history of nicotine dependence: Secondary | ICD-10-CM | POA: Insufficient documentation

## 2017-03-14 DIAGNOSIS — J189 Pneumonia, unspecified organism: Secondary | ICD-10-CM | POA: Insufficient documentation

## 2017-03-14 HISTORY — DX: Bronchitis, not specified as acute or chronic: J40

## 2017-03-14 MED ORDER — AZITHROMYCIN 250 MG PO TABS
500.0000 mg | ORAL_TABLET | Freq: Once | ORAL | Status: AC
  Administered 2017-03-14: 500 mg via ORAL
  Filled 2017-03-14: qty 2

## 2017-03-14 MED ORDER — ONDANSETRON 4 MG PO TBDP
4.0000 mg | ORAL_TABLET | Freq: Once | ORAL | Status: AC
  Administered 2017-03-14: 4 mg via ORAL
  Filled 2017-03-14: qty 1

## 2017-03-14 MED ORDER — ALBUTEROL SULFATE HFA 108 (90 BASE) MCG/ACT IN AERS
2.0000 | INHALATION_SPRAY | RESPIRATORY_TRACT | Status: DC | PRN
  Administered 2017-03-14: 2 via RESPIRATORY_TRACT
  Filled 2017-03-14: qty 6.7

## 2017-03-14 MED ORDER — AZITHROMYCIN 250 MG PO TABS: 250.0000 mg | ORAL_TABLET | Freq: Every day | ORAL | 0 refills | Status: AC

## 2017-03-14 MED ORDER — ALBUTEROL SULFATE (2.5 MG/3ML) 0.083% IN NEBU
INHALATION_SOLUTION | RESPIRATORY_TRACT | Status: AC
  Administered 2017-03-14: 5 mg via RESPIRATORY_TRACT
  Filled 2017-03-14: qty 6

## 2017-03-14 MED ORDER — PREDNISONE 20 MG PO TABS: 40.0000 mg | ORAL_TABLET | Freq: Every day | ORAL | 0 refills | Status: DC

## 2017-03-14 MED ORDER — AEROCHAMBER PLUS FLO-VU MEDIUM MISC
1.0000 | Freq: Once | Status: AC
  Administered 2017-03-14: 1
  Filled 2017-03-14: qty 1

## 2017-03-14 MED ORDER — ACETAMINOPHEN 325 MG PO TABS
650.0000 mg | ORAL_TABLET | Freq: Once | ORAL | Status: AC | PRN
  Administered 2017-03-14: 650 mg via ORAL
  Filled 2017-03-14: qty 2

## 2017-03-14 MED ORDER — PREDNISONE 20 MG PO TABS
40.0000 mg | ORAL_TABLET | Freq: Once | ORAL | Status: AC
  Administered 2017-03-14: 40 mg via ORAL
  Filled 2017-03-14: qty 2

## 2017-03-14 MED ORDER — PROMETHAZINE HCL 25 MG PO TABS: 25.0000 mg | ORAL_TABLET | Freq: Three times a day (TID) | ORAL | 0 refills | Status: DC | PRN

## 2017-03-14 MED ORDER — ALBUTEROL SULFATE (2.5 MG/3ML) 0.083% IN NEBU
5.0000 mg | INHALATION_SOLUTION | Freq: Once | RESPIRATORY_TRACT | Status: AC
  Administered 2017-03-14: 5 mg via RESPIRATORY_TRACT

## 2017-03-14 MED FILL — AZITHROMYCIN 250 MG TABLET: 250 | 4 days supply | Qty: 4 | Fill #0

## 2017-03-14 MED FILL — PROMETHAZINE 25 MG TABLET: 25 | 2 days supply | Qty: 6 | Fill #0

## 2017-03-14 MED FILL — predniSONE 20 MG TABS: 20 | 4 days supply | Qty: 8 | Fill #0

## 2017-03-14 NOTE — ED Provider Notes (Signed)
[facility] EMERGENC[facility] DEPARTMENT Provider Note   CSN: [id number] Arrival date & time: 03/14/17  0801     History   Chief Complaint Chief Complaint  Patient presents with  . Fever    HPI Benjamin Reed is a 32 y.o. male.  32 year old male with past medical history including eosinophilic esophagitis, bronchitis, IBS who presents with cough, fever, and flulike symptoms.  Patient reports flulike symptoms for almost 1 week including cough, nasal congestion, intermittent diarrhea and vomiting.  Yesterday he felt like he was improving but then this morning he began feeling worse again with fevers up to 103.7 at home and new shortness of breath.  He has multiple sick contacts at home.  No medications prior to arrival but he has been taking Tylenol and DayQuil without relief.   The history is provided by the patient.  Fever      Past Medical History:  Diagnosis Date  . Bronchitis   . 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 Chronic lower back pain    history of radiofrequency ablation  . Eosinophilic esophagitis   . Heartburn   . History of febrile seizure    as a child  . IBS (irritable bowel syndrome)    no current meds.  . Nasal congestion 12/09/2012    Patient Active Problem List   Diagnosis Date Noted  . NECK PAIN 03/21/2010  . CHEST PAIN 03/21/2010  . ESOPHAGITIS 02/01/2010  . GI BLEEDING 02/01/2010  . ELEVATED BLOOD PRESSURE WITHOUT DIAGNOSIS OF HYPERTENSION 02/01/2010    Past Surgical History:  Procedure Laterality Date  . HEMORRHOID SURGERY  04/12/2010   procedure for prolapse and hemorrhoids  . KNEE ARTHROSCOPY Left 2013  . KNEE ARTHROSCOPY Right 12/16/2012   Procedure: RIGHT ARTHROSCOPY KNEE;  Surgeon: Hessie Dibble, MD;  Location: [place];  Service: Orthopedics;  Laterality: Right;       Home Medications    Prior to Admission medications   Medication Sig Start Date End Date Taking? Authorizing Provider    acetaminophen (TYLENOL) 500 MG tablet Take 1,000 mg by mouth every 6 (six) hours as needed (pain).    [provider]  albuterol (PROVENTIL HFA;VENTOLIN HFA) 108 (90 Base) MCG/ACT inhaler Inhale 1-2 puffs into the lungs every 6 (six) hours as needed for wheezing or shortness of breath. 01/04/16   Shary Decamp, PA-C  azithromycin (ZITHROMAX) 250 MG tablet Take 1 tablet (250 mg total) by mouth daily for 4 days. 03/14/17 03/18/17  Lothar Prehn, Wenda Overland, MD  famotidine (PEPCID) 20 MG tablet Take 1 tablet (20 mg total) by mouth 2 (two) times daily. 02/08/16   Mesner, Corene Cornea, MD  ondansetron (ZOFRAN) 4 MG tablet Take 1 tablet (4 mg total) by mouth every 8 (eight) hours as needed for nausea or vomiting. 02/08/16   Mesner, Corene Cornea, MD  pantoprazole (PROTONIX) 20 MG tablet Take 1 tablet (20 mg total) by mouth daily. 02/08/16   Mesner, Corene Cornea, MD  predniSONE (DELTASONE) 20 MG tablet Take 2 tablets (40 mg total) by mouth daily. 03/14/17   Ranjit Ashurst, Wenda Overland, MD  promethazine (PHENERGAN) 25 MG tablet Take 1 tablet (25 mg total) by mouth every 8 (eight) hours as needed for nausea or vomiting. 03/14/17   Ozelle Brubacher, Wenda Overland, MD    Family History Family History  Problem Relation Age of Onset  . Hyperlipidemia Mother        GPs  . Hypertension Mother        GPs  .  Prostate cancer Paternal Grandfather   . Colon cancer Unknown        GGM dx in her 60s to 44s  . Colon polyps Unknown        GF dx in his late 56s, aunt    Social History Social History   Tobacco Use  . Smoking status: Former Smoker    Packs/day: 0.50    Types: Cigarettes    Last attempt to quit: 11/07/2012    Years since quitting: 4.3  . Smokeless tobacco: Current User  Substance Use Topics  . Alcohol use: Yes    Comment: weekly prior to c/o x today  . Drug use: No     Allergies   Zoloft [sertraline hcl]; Cephalexin; Other; and Nsaids   Review of Systems Review of Systems  Constitutional: Positive for fever.   All other  systems reviewed and are negative except that which was mentioned in HPI   Physical Exam Updated Vital Signs BP 120/83 (BP Location: Right Arm)   Pulse (!) 102   Temp 99.4 F (37.4 C) (Oral)   Resp 20   Ht 5\' 10"  (1.778 m)   Wt 68 kg (150 lb)   SpO2 97%   BMI 21.52 kg/m   Physical Exam  Constitutional: He is oriented to person, place, and time. He appears well-developed and well-nourished. No distress.  HENT:  Head: Normocephalic and atraumatic.  Mouth/Throat: Oropharynx is clear and moist.  Moist mucous membranes  Eyes: Conjunctivae are normal.  Neck: Neck supple.  Cardiovascular: Normal rate, regular rhythm and normal heart sounds.  No murmur heard. Pulmonary/Chest: Effort normal.  Coarse breath sounds b/l, frequent cough  Abdominal: Soft. Bowel sounds are normal. He exhibits no distension. There is no tenderness.  Musculoskeletal: He exhibits no edema.  Neurological: He is alert and oriented to person, place, and time.  Fluent speech  Skin: Skin is warm and dry.  Psychiatric: He has a normal mood and affect. Judgment normal.  Nursing note and vitals reviewed.    ED Treatments / Results  Labs (all labs ordered are listed, but only abnormal results are displayed) Labs Reviewed - No data to display  EKG  EKG Interpretation None       Radiology Dg Chest 2 View  Result Date: 03/14/2017 CLINICAL DATA:  Cough, fever and congestion. EXAM: CHEST  2 VIEW COMPARISON:  01/04/2016 FINDINGS: The heart size and mediastinal contours are within normal limits. Opacity within the medial left lung base noted, suspicious for pneumonia. The visualized skeletal structures are unremarkable. IMPRESSION: 1. Suspect medial left lung base pneumonia. Electronically Signed   By: Kerby Moors M.D.   On: 03/14/2017 08:41    Procedures Procedures (including critical care time)  Medications Ordered in ED Medications  acetaminophen (TYLENOL) tablet 650 mg (650 mg Oral Given 03/14/17  0819)  albuterol (PROVENTIL) (2.5 MG/3ML) 0.083% nebulizer solution 5 mg (5 mg Nebulization Given 03/14/17 0817)  ondansetron (ZOFRAN-ODT) disintegrating tablet 4 mg (4 mg Oral Given 03/14/17 0847)  azithromycin (ZITHROMAX) tablet 500 mg (500 mg Oral Given 03/14/17 0852)  predniSONE (DELTASONE) tablet 40 mg (40 mg Oral Given 03/14/17 0852)  AEROCHAMBER PLUS FLO-VU MEDIUM MISC 1 each (1 each Other Given 03/14/17 0904)     Initial Impression / Assessment and Plan / ED Course  I have reviewed the triage vital signs and the nursing notes.  Pertinent  imaging results that were available during my care of the patient were reviewed by me and considered in my medical  decision making (see chart for details).     Vs notable for mild fever on arrival, improved after tylenol. Normal WOB on exam, no hypoxia. CXR shows LLL pneumonia, which I suspect based on flu illness with sudden worsening of sx. He did improve after albuterol and given h/o smoking, also started on prednisone.  Gave first dose of azithromycin here and prescription to continue at home.  He was well-appearing on reassessment, drinking water and stating that he felt improved.  Return precautions reviewed.  Final Clinical Impressions(s) / ED Diagnoses   Final diagnoses:  Community acquired pneumonia of left lung, unspecified part of lung    ED Discharge Orders        Ordered    predniSONE (DELTASONE) 20 MG tablet  Daily     03/14/17 1121    azithromycin (ZITHROMAX) 250 MG tablet  Daily     03/14/17 1121    promethazine (PHENERGAN) 25 MG tablet  Every 8 hours PRN     03/14/17 1121       Meilani Edmundson, Wenda Overland, MD 03/14/17 [phone]

## 2017-03-14 NOTE — ED Notes (Signed)
ED Provider at bedside discussing test results, prescriptions and dispo plan of care.

## 2017-03-14 NOTE — ED Triage Notes (Addendum)
Flu-like sx x1 week - with the rest of the family.  Sts he felt better yesterday then during the night he began running a fever and is SOB. Also cough and vomiting.

## 2017-12-16 ENCOUNTER — Encounter (HOSPITAL_BASED_OUTPATIENT_CLINIC_OR_DEPARTMENT_OTHER): Payer: Self-pay | Admitting: *Deleted

## 2017-12-16 ENCOUNTER — Emergency Department (HOSPITAL_BASED_OUTPATIENT_CLINIC_OR_DEPARTMENT_OTHER): Payer: Self-pay

## 2017-12-16 ENCOUNTER — Other Ambulatory Visit: Payer: Self-pay

## 2017-12-16 ENCOUNTER — Emergency Department (HOSPITAL_BASED_OUTPATIENT_CLINIC_OR_DEPARTMENT_OTHER)
Admission: EM | Admit: 2017-12-16 | Discharge: 2017-12-16 | Disposition: A | Discharge status: Home / Self Care | Payer: Self-pay | Attending: Emergency Medicine | Admitting: Emergency Medicine

## 2017-12-16 DIAGNOSIS — Z87891 Personal history of nicotine dependence: Secondary | ICD-10-CM | POA: Insufficient documentation

## 2017-12-16 DIAGNOSIS — M79674 Pain in right toe(s): Secondary | ICD-10-CM | POA: Insufficient documentation

## 2017-12-16 DIAGNOSIS — Z79899 Other long term (current) drug therapy: Secondary | ICD-10-CM | POA: Insufficient documentation

## 2017-12-16 MED ORDER — TRAMADOL HCL 50 MG PO TABS: 50.0000 mg | ORAL_TABLET | Freq: Two times a day (BID) | ORAL | 0 refills | Status: DC | PRN

## 2017-12-16 MED FILL — traMADol HCL 50 MG TABS: 50 | 4 days supply | Qty: 8 | Fill #0
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
## 2017-12-16 NOTE — ED Notes (Signed)
Pt declined crutches, has set at home.
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
## 2017-12-16 NOTE — Discharge Instructions (Addendum)
Prescription given for tramadol. Take medication as directed and do not operate machinery, drive a car, or work while taking this medication as it can make you drowsy.   Please follow up with your primary care provider within 5-7 days for re-evaluation of your symptoms. If you do not have a primary care provider, information for a healthcare clinic has been provided for you to make arrangements for follow up care.  You were given a referral to the orthopedic doctor.  Please make an appointment for follow-up in the next 7 to 10 days as you may need repeat imaging to rule out an occult fracture.  Please return to the emergency department for any new or worsening symptoms.

## 2018-10-08 ENCOUNTER — Other Ambulatory Visit: Payer: Self-pay

## 2018-10-08 DIAGNOSIS — Z20822 Contact with and (suspected) exposure to covid-19: Secondary | ICD-10-CM

## 2018-10-09 LAB — NOVEL CORONAVIRUS, NAA: SARS-CoV-2, NAA: NOT DETECTED

## 2018-12-30 ENCOUNTER — Other Ambulatory Visit: Payer: Self-pay | Admitting: *Deleted

## 2018-12-30 DIAGNOSIS — Z20822 Contact with and (suspected) exposure to covid-19: Secondary | ICD-10-CM

## 2019-01-01 LAB — NOVEL CORONAVIRUS, NAA: SARS-CoV-2, NAA: NOT DETECTED

## 2020-02-22 ENCOUNTER — Telehealth: Payer: Self-pay

## 2020-02-22 NOTE — Telephone Encounter (Signed)
Patients mother is requesting to be worked in sooner for his new patient appt due to a lump they are concerned about. Please advise

## 2020-02-24 NOTE — Telephone Encounter (Signed)
LM making pt aware we could move his appt to 2/23 at 1230 or to 2/25 to 130///ELEA

## 2020-02-24 NOTE — Telephone Encounter (Signed)
I'm not sure if there is a new pt appt sooner than his 3/4 appt, but if there is, he can be moved up.  If not, I am not able to create space where there isn't any and he would need to either see his current doctor or go to [facility] if there is a concern

## 2020-03-23 ENCOUNTER — Ambulatory Visit (INDEPENDENT_AMBULATORY_CARE_PROVIDER_SITE_OTHER): Payer: 59 | Admitting: Family Medicine

## 2020-03-23 ENCOUNTER — Encounter: Payer: Self-pay | Admitting: Family Medicine

## 2020-03-23 ENCOUNTER — Other Ambulatory Visit: Payer: Self-pay

## 2020-03-23 VITALS — BP 120/80 | HR 97 | Temp 99.1°F | Resp 17 | Ht 69.0 in | Wt 137.0 lb

## 2020-03-23 DIAGNOSIS — R634 Abnormal weight loss: Secondary | ICD-10-CM | POA: Diagnosis not present

## 2020-03-23 DIAGNOSIS — R042 Hemoptysis: Secondary | ICD-10-CM

## 2020-03-23 DIAGNOSIS — R61 Generalized hyperhidrosis: Secondary | ICD-10-CM

## 2020-03-23 DIAGNOSIS — R590 Localized enlarged lymph nodes: Secondary | ICD-10-CM

## 2020-03-23 LAB — CBC WITH DIFFERENTIAL/PLATELET
Basophils Absolute: 0.1 10*3/uL (ref 0.0–0.1)
Basophils Relative: 1.1 % (ref 0.0–3.0)
Eosinophils Absolute: 0.1 10*3/uL (ref 0.0–0.7)
Eosinophils Relative: 2.2 % (ref 0.0–5.0)
HCT: 44.1 % (ref 39.0–52.0)
Hemoglobin: 15.1 g/dL (ref 13.0–17.0)
Lymphocytes Relative: 27.5 % (ref 12.0–46.0)
Lymphs Abs: 1.7 10*3/uL (ref 0.7–4.0)
MCHC: 34.3 g/dL (ref 30.0–36.0)
MCV: 88.4 fl (ref 78.0–100.0)
Monocytes Absolute: 0.3 10*3/uL (ref 0.1–1.0)
Monocytes Relative: 5.3 % (ref 3.0–12.0)
Neutro Abs: 4 10*3/uL (ref 1.4–7.7)
Neutrophils Relative %: 63.9 % (ref 43.0–77.0)
Platelets: 210 10*3/uL (ref 150.0–400.0)
RBC: 4.99 Mil/uL (ref 4.22–5.81)
RDW: 12.8 % (ref 11.5–15.5)
WBC: 6.2 10*3/uL (ref 4.0–10.5)

## 2020-03-23 LAB — BASIC METABOLIC PANEL
BUN: 11 mg/dL (ref 6–23)
CO2: 29 mEq/L (ref 19–32)
Calcium: 9.9 mg/dL (ref 8.4–10.5)
Chloride: 101 mEq/L (ref 96–112)
Creatinine, Ser: 0.92 mg/dL (ref 0.40–1.50)
GFR: 109.95 mL/min (ref >60.00)
Glucose, Bld: 92 mg/dL (ref 70–99)
Potassium: 3.8 mEq/L (ref 3.5–5.1)
Sodium: 139 mEq/L (ref 135–145)

## 2020-03-23 LAB — HEPATIC FUNCTION PANEL
ALT: 14 U/L (ref 0–53)
AST: 19 U/L (ref 0–37)
Albumin: 4.9 g/dL (ref 3.5–5.2)
Alkaline Phosphatase: 51 U/L (ref 39–117)
Bilirubin, Direct: 0.1 mg/dL (ref 0.0–0.3)
Total Bilirubin: 0.9 mg/dL (ref 0.2–1.2)
Total Protein: 7.8 g/dL (ref 6.0–8.3)

## 2020-03-23 LAB — TSH: TSH: 1.47 u[IU]/mL (ref 0.35–4.50)

## 2020-03-23 NOTE — Patient Instructions (Signed)
We will notify you of your lab results and determine the next steps We'll call you with your chest xray and ultrasound appts Make sure you are eating and drinking regularly to offset the weight loss Call with any questions or concerns Hang in there!  We'll get to the bottom of this!

## 2020-03-23 NOTE — Progress Notes (Signed)
Subjective:    Patient ID: Benjamin Reed, male    DOB: 05-02-1985, 35 y.o.   MRN: [id number]  HPI New to establish.  No recent PCP.  Has been going to [facility].  Knot under chin- noticed 'at least 2.5 yrs ago'.  Now enlarging.  Not painful unless 'messed with a lot'.  Size does not wax or wane.  No redness or drainage.    LAD- R groin, noticed 'at least 2 yrs or more'.  + TTP.  'it's just a dull ache that won't go away'.  No change in size or shape but has increased in #.  + night sweats x2-3 months.  Pt reports he has lost 10 lbs since the last time he weighed (December 2021) and notes a big decrease in appetite.  Pt notes that he has coughed up blood- most recently was yesterday.  He states that was since he was dx'd w/ 'chronic bronchitis' 4 yrs ago.   Review of Systems For ROS see HPI   This visit occurred during the SARS-CoV-2 public health emergency.  Safety protocols were in place, including screening questions prior to the visit, additional usage of staff PPE, and extensive cleaning of exam room while observing appropriate contact time as indicated for disinfecting solutions.       Objective:   Physical Exam Vitals reviewed.  Constitutional:      General: He is not in acute distress.    Comments: Very thin  HENT:     Head: Normocephalic and atraumatic.  Eyes:     Extraocular Movements: Extraocular movements intact.     Conjunctiva/sclera: Conjunctivae normal.     Pupils: Pupils are equal, round, and reactive to light.  Cardiovascular:     Rate and Rhythm: Normal rate and regular rhythm.     Pulses: Normal pulses.     Heart sounds: Normal heart sounds.  Pulmonary:     Effort: Pulmonary effort is normal. No respiratory distress.     Breath sounds: Normal breath sounds. No wheezing or rhonchi.  Chest:  Breasts:     Right: No axillary adenopathy or supraclavicular adenopathy.     Left: No axillary adenopathy or supraclavicular adenopathy.    Abdominal:     General:  Abdomen is flat. There is no distension.     Palpations: Abdomen is soft. There is no mass.     Tenderness: There is no abdominal tenderness.     Comments: No obvious hepatosplenomegaly  Musculoskeletal:        General: No swelling.     Cervical back: Normal range of motion and neck supple.  Lymphadenopathy:     Head:     Right side of head: Submandibular adenopathy present. No submental, preauricular, posterior auricular or occipital adenopathy.     Left side of head: No submental, submandibular, preauricular, posterior auricular or occipital adenopathy.     Cervical: Cervical adenopathy (R submandibular ~1.5 cm) present.     Right cervical: No posterior cervical adenopathy.    Left cervical: No posterior cervical adenopathy.     Upper Body:     Right upper body: No supraclavicular, axillary or pectoral adenopathy.     Left upper body: No supraclavicular, axillary or pectoral adenopathy.     Lower Body: Right inguinal adenopathy present. Left inguinal adenopathy present.  Skin:    General: Skin is warm and dry.     Coloration: Skin is pale.  Neurological:     General: No focal deficit present.  Mental Status: He is alert and oriented to person, place, and time.           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  No TTP.  Firm.  No other associated head or neck LAD on exam today.  Weight loss and night sweats are concerning, as is his hemoptysis.  Check CBC.  Get [place] of node.  Pt expressed understanding and is in agreement w/ plan.   Inguinal LAD- new to provider, ongoing for pt.  He is aware of the L side as this is uncomfortable for him.  Also has shotty LAD on R.  Again, his systemic sxs are concerning.  Check labs and get [place]  Hemoptysis- new.  Pt reports he has been coughing up blood 'for years' since being dx'd w/ chronic bronchitis.  Most recent was yesterday.  Has a picture of what he coughs up and it looks like a bloody mucous plug.   Denies any known TB exposures.  Will check [place]ifer[place].  Will also get CXR.  Again, systemic sxs are concerning.  Weight loss/night sweats- new.  Sxs started 2-3 months ago.  Pt reports he is down 10 lbs since December.  He is very thin.  Says he will wake up 'drenched' at night.  Given his constellation of sxs, this is concerning for possible TB or malignancy.  Will proceed w/ workup- labs, imaging.

## 2020-03-25 ENCOUNTER — Other Ambulatory Visit: Payer: Self-pay

## 2020-03-25 ENCOUNTER — Emergency Department (HOSPITAL_BASED_OUTPATIENT_CLINIC_OR_DEPARTMENT_OTHER): Payer: 59

## 2020-03-25 ENCOUNTER — Emergency Department (HOSPITAL_BASED_OUTPATIENT_CLINIC_OR_DEPARTMENT_OTHER)
Admission: EM | Admit: 2020-03-25 | Discharge: 2020-03-25 | Disposition: A | Discharge status: Home / Self Care | Payer: 59 | Attending: Emergency Medicine | Admitting: Emergency Medicine

## 2020-03-25 ENCOUNTER — Encounter (HOSPITAL_BASED_OUTPATIENT_CLINIC_OR_DEPARTMENT_OTHER): Payer: Self-pay

## 2020-03-25 DIAGNOSIS — I10 Essential (primary) hypertension: Secondary | ICD-10-CM | POA: Diagnosis not present

## 2020-03-25 DIAGNOSIS — F419 Anxiety disorder, unspecified: Secondary | ICD-10-CM | POA: Insufficient documentation

## 2020-03-25 DIAGNOSIS — R110 Nausea: Secondary | ICD-10-CM | POA: Diagnosis not present

## 2020-03-25 DIAGNOSIS — R000 Tachycardia, unspecified: Secondary | ICD-10-CM | POA: Insufficient documentation

## 2020-03-25 DIAGNOSIS — R079 Chest pain, unspecified: Secondary | ICD-10-CM

## 2020-03-25 DIAGNOSIS — R5383 Other fatigue: Secondary | ICD-10-CM | POA: Diagnosis not present

## 2020-03-25 DIAGNOSIS — R059 Cough, unspecified: Secondary | ICD-10-CM | POA: Diagnosis not present

## 2020-03-25 DIAGNOSIS — R61 Generalized hyperhidrosis: Secondary | ICD-10-CM | POA: Insufficient documentation

## 2020-03-25 DIAGNOSIS — M549 Dorsalgia, unspecified: Secondary | ICD-10-CM | POA: Diagnosis not present

## 2020-03-25 DIAGNOSIS — R0602 Shortness of breath: Secondary | ICD-10-CM | POA: Diagnosis not present

## 2020-03-25 DIAGNOSIS — R072 Precordial pain: Secondary | ICD-10-CM | POA: Diagnosis present

## 2020-03-25 DIAGNOSIS — Z87891 Personal history of nicotine dependence: Secondary | ICD-10-CM | POA: Insufficient documentation

## 2020-03-25 DIAGNOSIS — R55 Syncope and collapse: Secondary | ICD-10-CM | POA: Diagnosis not present

## 2020-03-25 LAB — HEPATIC FUNCTION PANEL
ALT: 17 U/L (ref 0–44)
AST: 19 U/L (ref 15–41)
Albumin: 5 g/dL (ref 3.5–5.0)
Alkaline Phosphatase: 45 U/L (ref 38–126)
Bilirubin, Direct: 0.2 mg/dL (ref 0.0–0.2)
Indirect Bilirubin: 0.7 mg/dL (ref 0.3–0.9)
Total Bilirubin: 0.9 mg/dL (ref 0.3–1.2)
Total Protein: 7.6 g/dL (ref 6.5–8.1)

## 2020-03-25 LAB — CBC
HCT: 44.1 % (ref 39.0–52.0)
Hemoglobin: 15.3 g/dL (ref 13.0–17.0)
MCH: 30.2 pg (ref 26.0–34.0)
MCHC: 34.7 g/dL (ref 30.0–36.0)
MCV: 87.2 fL (ref 80.0–100.0)
Platelets: 248 10*3/uL (ref 150–400)
RBC: 5.06 MIL/uL (ref 4.22–5.81)
RDW: 12.2 % (ref 11.5–15.5)
WBC: 7 10*3/uL (ref 4.0–10.5)
nRBC: 0 % (ref 0.0–0.2)

## 2020-03-25 LAB — BASIC METABOLIC PANEL
Anion gap: 12 (ref 5–15)
BUN: 11 mg/dL (ref 6–20)
CO2: 25 mmol/L (ref 22–32)
Calcium: 9.6 mg/dL (ref 8.9–10.3)
Chloride: 101 mmol/L (ref 98–111)
Creatinine, Ser: 0.88 mg/dL (ref 0.61–1.24)
GFR, Estimated: >60 mL/min (ref >60)
Glucose, Bld: 102 mg/dL — ABNORMAL HIGH (ref 70–99)
Potassium: 4 mmol/L (ref 3.5–5.1)
Sodium: 138 mmol/L (ref 135–145)

## 2020-03-25 LAB — TROPONIN I (HIGH SENSITIVITY)
Troponin I (High Sensitivity): <2 ng/L (ref <18)
Troponin I (High Sensitivity): <2 ng/L (ref <18)

## 2020-03-25 LAB — QUANTIFERON-TB GOLD PLUS
Mitogen-NIL: >10 IU/mL
NIL: 0.02 IU/mL
QuantiFERON-TB Gold Plus: NEGATIVE
TB1-NIL: 0 IU/mL
TB2-NIL: 0 IU/mL

## 2020-03-25 LAB — LIPASE, BLOOD: Lipase: 92 U/L — ABNORMAL HIGH (ref 11–51)

## 2020-03-25 MED ORDER — IOHEXOL 300 MG/ML  SOLN: 100.0000 mL | Freq: Once | INTRAMUSCULAR | Status: DC | PRN

## 2020-03-25 MED ORDER — IOHEXOL 350 MG/ML SOLN
100.0000 mL | Freq: Once | INTRAVENOUS | Status: AC | PRN
  Administered 2020-03-25: 100 mL via INTRAVENOUS

## 2020-03-25 NOTE — ED Provider Notes (Signed)
Signout from Dr. Sherry Ruffing.  35 year old male here with pleuritic left-sided chest pain that began this morning.  He is also had an ongoing work-up with his PCP for lymphadenopathy.  Plan is to follow-up on results of CT PE and serial troponins and trending pulse ox. Physical Exam  BP 113/76 (BP Location: Left Arm)   Pulse 69   Temp 98.5 F (36.9 C) (Oral)   Resp 20   SpO2 98%   Physical Exam  ED Course/Procedures     Procedures  MDM  Patient is awake alert nontoxic-appearing.  Heart rate in the 80s.  Pulse ox has been 99% on room air.  He says the pain is better than this morning.  I reviewed the rest results with him and he says his doctor is within the [facility] system so they will be able to see our CAT scan results.  He is comfortable plan for discharge.  Will check trending pulse ox.       Benjamin Rasmussen, MD 03/26/20 [phone]

## 2020-03-25 NOTE — Discharge Instructions (Addendum)
You were seen in the emergency department for some left-sided chest pain and shortness of breath.  You had blood work EKG and a CAT scan of your chest that did not show an obvious explanation for your pain.  They did show some pulmonary nodules on your CAT scan that will need follow-up with your primary care doctor.  Return to the emergency department if any worsening or concerning symptoms

## 2020-03-25 NOTE — ED Triage Notes (Addendum)
Pt c/o CP/SOB since 430am-NAD-steady gait

## 2020-03-25 NOTE — ED Provider Notes (Signed)
[facility] EMERGENCY DEPARTMENT Provider Note   CSN: [id number] Arrival date & time: 03/25/20  1157     History Chief Complaint  Patient presents with  . Chest Pain    Benjamin Reed is a 35 y.o. male.  The history is provided by the patient and medical records. No language interpreter was used.  Chest Pain Pain location:  Substernal area Pain quality: crushing and pressure   Pain radiates to:  Upper back Pain severity:  Moderate Onset quality:  Sudden Duration:  12 hours Timing:  Constant Progression:  Waxing and waning Chronicity:  New Context: breathing   Relieved by:  Nothing Worsened by:  Exertion and deep breathing Ineffective treatments:  None tried Associated symptoms: back pain, cough, diaphoresis, fatigue, nausea, near-syncope and shortness of breath   Associated symptoms: no abdominal pain, no altered mental status, no dizziness, no fever, no headache, no lower extremity edema, no numbness, no palpitations, no vomiting and no weakness        Past Medical History:  Diagnosis Date  . Bronchitis   . 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 Chronic lower back pain    history of radiofrequency ablation  . Eosinophilic esophagitis   . Heartburn   . History of febrile seizure    as a child  . IBS (irritable bowel syndrome)    no current meds.  . Nasal congestion 12/09/2012    Patient Active Problem List   Diagnosis Date Noted  . NECK PAIN 03/21/2010  . CHEST PAIN 03/21/2010  . ESOPHAGITIS 02/01/2010  . GI BLEEDING 02/01/2010  . ELEVATED BLOOD PRESSURE WITHOUT DIAGNOSIS OF HYPERTENSION 02/01/2010    Past Surgical History:  Procedure Laterality Date  . HEMORRHOID SURGERY  04/12/2010   procedure for prolapse and hemorrhoids  . KNEE ARTHROSCOPY Left 2013  . KNEE ARTHROSCOPY Right 12/16/2012   Procedure: RIGHT ARTHROSCOPY KNEE;  Surgeon: Hessie Dibble, MD;  Location: [facility];  Service: Orthopedics;  Laterality: Right;   . knee surgery Bilateral    a plica removed from each knee       Family History  Problem Relation Age of Onset  . Hyperlipidemia Mother        GPs  . Hypertension Mother        GPs  . Prostate cancer Paternal Grandfather   . Colon cancer Other        GGM dx in her 20s to 37s  . Colon polyps Other        GF dx in his late 66s, aunt    Social History   Tobacco Use  . Smoking status: Former Smoker    Packs/day: 0.50    Types: Cigarettes    Quit date: 11/07/2012    Years since quitting: 7.3  . Smokeless tobacco: Current User  Substance Use Topics  . Alcohol use: Yes    Comment: weekly prior to c/o x today  . Drug use: No    Home Medications Prior to Admission medications   Medication Sig Start Date End Date Taking? Authorizing Provider  acetaminophen (TYLENOL) 500 MG tablet Take 1,000 mg by mouth every 6 (six) hours as needed (pain).    [provider]  albuterol (PROVENTIL HFA;VENTOLIN HFA) 108 (90 Base) MCG/ACT inhaler Inhale 1-2 puffs into the lungs every 6 (six) hours as needed for wheezing or shortness of breath. 01/04/16   Shary Decamp, PA-C  famotidine (PEPCID) 20 MG tablet Take 1 tablet (20 mg total) by mouth  2 (two) times daily. 02/08/16   Mesner, Corene Cornea, MD    Allergies    Zoloft [sertraline hcl], Cephalexin, Other, and Nsaids  Review of Systems   Review of Systems  Constitutional: Positive for diaphoresis and fatigue. Negative for chills and fever.  HENT: Negative for congestion.   Eyes: Negative for visual disturbance.  Respiratory: Positive for cough, chest tightness and shortness of breath. Negative for wheezing.   Cardiovascular: Positive for chest pain and near-syncope. Negative for palpitations and leg swelling.  Gastrointestinal: Positive for nausea. Negative for abdominal pain, constipation, diarrhea and vomiting.  Genitourinary: Negative for flank pain.  Musculoskeletal: Positive for back pain. Negative for neck pain and neck stiffness.   Neurological: Positive for light-headedness. Negative for dizziness, syncope, weakness, numbness and headaches.  Psychiatric/Behavioral: Negative for agitation.  All other systems reviewed and are negative.   Physical Exam Updated Vital Signs BP 132/82 (BP Location: Right Arm)   Pulse 62   Temp 98.5 F (36.9 C) (Oral)   Resp 18   SpO2 100%   Physical Exam Vitals and nursing note reviewed.  Constitutional:      General: He is not in acute distress.    Appearance: He is well-developed and well-nourished. He is not ill-appearing, toxic-appearing or diaphoretic.  HENT:     Head: Normocephalic and atraumatic.  Eyes:     Extraocular Movements: Extraocular movements intact.     Conjunctiva/sclera: Conjunctivae normal.     Pupils: Pupils are equal, round, and reactive to light.  Cardiovascular:     Rate and Rhythm: Regular rhythm. Tachycardia present.     Heart sounds: Normal heart sounds. No murmur heard.   Pulmonary:     Effort: Pulmonary effort is normal. No respiratory distress.     Breath sounds: Normal breath sounds. No decreased breath sounds, wheezing or rhonchi.  Chest:     Chest wall: Tenderness present.  Abdominal:     Palpations: Abdomen is soft.     Tenderness: There is no abdominal tenderness.  Musculoskeletal:        General: No edema.     Cervical back: Neck supple.     Right lower leg: No tenderness. No edema.     Left lower leg: No tenderness. No edema.  Skin:    General: Skin is warm and dry.     Capillary Refill: Capillary refill takes less than 2 seconds.     Findings: No erythema.  Neurological:     General: No focal deficit present.     Mental Status: He is alert.  Psychiatric:        Mood and Affect: Mood is anxious.     ED Results / Procedures / Treatments   Labs (all labs ordered are listed, but only abnormal results are displayed) Labs Reviewed  BASIC METABOLIC PANEL - Abnormal; Notable for the following components:      Result Value    Glucose, Bld 102 (*)    All other components within normal limits  LIPASE, BLOOD - Abnormal; Notable for the following components:   Lipase 92 (*)    All other components within normal limits  CBC  HEPATIC FUNCTION PANEL  TROPONIN I (HIGH SENSITIVITY)  TROPONIN I (HIGH SENSITIVITY)    EKG EKG Interpretation  Date/Time:  Friday March 25 2020 12:00:15 EST Ventricular Rate:  75 PR Interval:  122 QRS Duration: 80 QT Interval:  384 QTC Calculation: 428 R Axis:   95 Text Interpretation: Normal sinus rhythm Rightward axis Nonspecific T wave  abnormality Abnormal ECG When compared to prior, similar appearance. No STEMI Confirmed by Antony Blackbird [phone]) on 03/25/2020 2:14:19 PM   Radiology DG Chest Portable 1 View  Result Date: 03/25/2020 CLINICAL DATA:  Chest pain, shortness of breath EXAM: PORTABLE CHEST 1 VIEW COMPARISON:  03/14/2017 FINDINGS: The heart size and mediastinal contours are within normal limits. Both lungs are clear. The visualized skeletal structures are unremarkable. IMPRESSION: No active disease. Electronically Signed   By: Davina Poke D.O.   On: 03/25/2020 13:21    Procedures Procedures    Medications Ordered in ED Medications  iohexol (OMNIPAQUE) 350 MG/ML injection 100 mL (100 mLs Intravenous Contrast Given 03/25/20 1512)    ED Course  I have reviewed the triage vital signs and the nursing notes.  Pertinent labs & imaging results that were available during my care of the patient were reviewed by me and considered in my medical decision making (see chart for details).    MDM Rules/Calculators/A&P                          Benjamin Reed is a 35 y.o. male prior eosinophilic esophagitis, irritable bowel syndrome, heartburn, chronic bronchitis, and currently under work-up for abnormal lymphadenopathy who presents with new chest pain and shortness of breath.  Patient reports that he is currently being worked up by his PCP for a multitude of symptoms  including night sweats, weight loss, and diffuse lymph nodes.  He says that he is currently scheduled to get ultrasounds performed of multiple lymph node sites and further work-up however this morning he woke up at 4:30 AM with new pain.  He reports he woke up with severe central chest pain that radiates towards his back.  It is a pressure and not a sharpness.  He reports it is very pleuritic and exertional.  He gets very winded doing any type of talking or ambulating.  This is all new for him.  He does report he chronically has cough with some hemoptysis but this is unchanged.  He denies new trauma.  Denies any syncope but reported near syncope and lightheadedness today.  He reports "everybody in my family has factor V Leiden" but he is never been tested for it.  He denies any new leg pain or leg swelling.  Denies any urinary or GI changes.  He reports his pain is mild to moderate but when he tries to breathe or ambulate it worsens.  On exam, lungs are clear and chest is only mildly tender.  No murmur.  Abdomen nontender.  Good pulses in extremities.  Back nontender.  Patient maintaining oxygen saturations on room air but he is tachycardic at around 120 during my initial evaluation.  Clinically I feel we do need to rule out pulmonary embolism as a cause of his pleuritic chest pain and shortness of breath with tachycardia and a verbal report that everyone has factor V Leiden in his family and he has not been tested.  We did discuss that if his blood pressure was significant elevated, with pain going to his back we would consider aortic etiology however his blood pressure was 893 systolic and the discomfort was not sharp.  Patient agrees to focus on the PE study.  We will also get screening labs including a hepatic function and lipase given the discomfort being in the lower chest as well.  Initial labs showed normal troponin and his EKG showed no STEMI.  His initial chest x-ray  was reassuring.  Anticipate  reassessment after CT PE study, delta troponin, and other labs.  Work-up is reassuring, anticipate he may have symptoms related to reflux versus musculoskeletal discomfort given the tenderness.  If we do not find any concerning findings, dissipate discharge to continue his outpatient work-up for his more chronic problems.    Care transferred to oncoming team awaiting for work-up to be completed.        Final Clinical Impression(s) / ED Diagnoses Final diagnoses:  Precordial pain  Exertional shortness of breath  Exertional chest pain     Clinical Impression: 1. Precordial pain   2. Exertional shortness of breath   3. Exertional chest pain     Disposition: Admit  This note was prepared with assistance of Dragon voice recognition software. Occasional wrong-word or sound-a-like substitutions may have occurred due to the inherent limitations of voice recognition software.     Mako Pelfrey, Gwenyth Allegra, MD 03/25/20 [phone]

## 2020-03-25 NOTE — ED Notes (Signed)
Awoke this am at approx 0430hrs having chest pain mid sternal radiating to mid shoulder blades, had some dizziness, had some SOB at episode as well.

## 2020-03-25 NOTE — ED Notes (Signed)
States his chest pain is intermittent, also has intermittent SOB, that at times just talking increases SOB episodes

## 2020-03-25 NOTE — ED Notes (Signed)
PT AMBULATED PER EDP ORDERS, STANDING AT BEDSIDE RESTING / BASELINE POX ON RA 98%-99%, UPON WALKING AROUND ED, NO C/O FEELING LIGHTHEADED OR SOB WAS DEMONSTRATED, POX DURING AMBULATION 94% - 95%. PT TOLERATED AMBULATION VERY WELL.

## 2020-03-28 ENCOUNTER — Telehealth: Payer: Self-pay | Admitting: Family Medicine

## 2020-03-28 NOTE — Telephone Encounter (Signed)
FYI - patient went to Emergency Room on Friday because of tightness in his chest.  He was released same day but mom wanted Dr.Tabori to know so she could look at this chart.

## 2020-03-28 NOTE — Telephone Encounter (Signed)
Mom called and wanted to know if you could look at the patient chart from where he went to the ER on Friday.

## 2020-03-28 NOTE — Telephone Encounter (Signed)
This was already reviewed.  I am waiting on pt's [place] reports to determine the next steps

## 2020-03-28 NOTE — Telephone Encounter (Signed)
Called and left patient a message that as soon as the report comes back and reviewed, he will be notified.

## 2020-03-31 ENCOUNTER — Other Ambulatory Visit: Payer: Self-pay

## 2020-03-31 ENCOUNTER — Ambulatory Visit (HOSPITAL_COMMUNITY)
Admission: RE | Admit: 2020-03-31 | Discharge: 2020-03-31 | Disposition: A | Discharge status: Home / Self Care | Payer: 59 | Source: Ambulatory Visit | Attending: Family Medicine | Admitting: Family Medicine

## 2020-03-31 DIAGNOSIS — R590 Localized enlarged lymph nodes: Secondary | ICD-10-CM

## 2020-04-01 ENCOUNTER — Ambulatory Visit: Payer: Self-pay | Admitting: Family Medicine

## 2020-04-06 ENCOUNTER — Other Ambulatory Visit: Payer: Self-pay

## 2020-04-06 ENCOUNTER — Telehealth: Payer: Self-pay | Admitting: Family Medicine

## 2020-04-06 DIAGNOSIS — R918 Other nonspecific abnormal finding of lung field: Secondary | ICD-10-CM

## 2020-04-06 NOTE — Telephone Encounter (Signed)
Since he is having SOB and chest tightness we can refer to pulmonary (dx shortness of breath, pulmonary nodules)

## 2020-04-06 NOTE — Telephone Encounter (Signed)
Pt called in asking what should the next steps on the lymph nodes and wanted to know what to do about the findings on the CT scan. Pt states that he found another lump in his neck that.   Please advise   Wanted to know what he could take for body aches and pain.   Pt can be reached at the home #

## 2020-04-06 NOTE — Telephone Encounter (Signed)
Referral placed  for patient to pulmonary dx: Sob, pulmonary nodules. Patient called and notiifed.

## 2020-04-06 NOTE — Telephone Encounter (Signed)
Called patient and he stated that he has found another lump on the left side between jaw line and ear.No pain associated with it but he was told to follow up with you in regards to the lymph nodes being that he is having Sob, and tightness in chest. Also for you to look at his CT scan. Please advise

## 2020-05-04 ENCOUNTER — Encounter: Payer: Self-pay | Admitting: *Deleted

## 2020-05-05 ENCOUNTER — Other Ambulatory Visit (HOSPITAL_COMMUNITY)
Admission: RE | Admit: 2020-05-05 | Discharge: 2020-05-05 | Disposition: A | Discharge status: Home / Self Care | Payer: 59 | Source: Ambulatory Visit | Attending: Internal Medicine | Admitting: Internal Medicine

## 2020-05-05 ENCOUNTER — Telehealth: Payer: Self-pay | Admitting: Internal Medicine

## 2020-05-05 ENCOUNTER — Other Ambulatory Visit: Payer: Self-pay

## 2020-05-05 ENCOUNTER — Encounter: Payer: Self-pay | Admitting: Internal Medicine

## 2020-05-05 ENCOUNTER — Ambulatory Visit: Payer: 59 | Admitting: Internal Medicine

## 2020-05-05 DIAGNOSIS — R0600 Dyspnea, unspecified: Secondary | ICD-10-CM

## 2020-05-05 DIAGNOSIS — R1310 Dysphagia, unspecified: Secondary | ICD-10-CM | POA: Diagnosis not present

## 2020-05-05 DIAGNOSIS — R0609 Other forms of dyspnea: Secondary | ICD-10-CM | POA: Insufficient documentation

## 2020-05-05 LAB — CBC WITH DIFFERENTIAL/PLATELET
Abs Immature Granulocytes: 0.01 10*3/uL (ref 0.00–0.07)
Basophils Absolute: 0.1 10*3/uL (ref 0.0–0.1)
Basophils Relative: 1 %
Eosinophils Absolute: 0.1 10*3/uL (ref 0.0–0.5)
Eosinophils Relative: 2 %
HCT: 46.2 % (ref 39.0–52.0)
Hemoglobin: 15.4 g/dL (ref 13.0–17.0)
Immature Granulocytes: 0 %
Lymphocytes Relative: 24 %
Lymphs Abs: 1.3 10*3/uL (ref 0.7–4.0)
MCH: 30.1 pg (ref 26.0–34.0)
MCHC: 33.3 g/dL (ref 30.0–36.0)
MCV: 90.2 fL (ref 80.0–100.0)
Monocytes Absolute: 0.4 10*3/uL (ref 0.1–1.0)
Monocytes Relative: 7 %
Neutro Abs: 3.7 10*3/uL (ref 1.7–7.7)
Neutrophils Relative %: 66 %
Platelets: 228 10*3/uL (ref 150–400)
RBC: 5.12 MIL/uL (ref 4.22–5.81)
RDW: 12.3 % (ref 11.5–15.5)
WBC: 5.6 10*3/uL (ref 4.0–10.5)
nRBC: 0 % (ref 0.0–0.2)

## 2020-05-05 LAB — URINALYSIS, ROUTINE W REFLEX MICROSCOPIC
Bilirubin Urine: NEGATIVE
Glucose, UA: NEGATIVE mg/dL
Hgb urine dipstick: NEGATIVE
Ketones, ur: NEGATIVE mg/dL
Leukocytes,Ua: NEGATIVE
Nitrite: NEGATIVE
Protein, ur: NEGATIVE mg/dL
Specific Gravity, Urine: 1.019 (ref 1.005–1.030)
pH: 7 (ref 5.0–8.0)

## 2020-05-05 LAB — BASIC METABOLIC PANEL
Anion gap: 9 (ref 5–15)
BUN: 13 mg/dL (ref 6–20)
CO2: 28 mmol/L (ref 22–32)
Calcium: 9.6 mg/dL (ref 8.9–10.3)
Chloride: 102 mmol/L (ref 98–111)
Creatinine, Ser: 0.86 mg/dL (ref 0.61–1.24)
GFR, Estimated: >60 mL/min (ref >60)
Glucose, Bld: 102 mg/dL — ABNORMAL HIGH (ref 70–99)
Potassium: 3.9 mmol/L (ref 3.5–5.1)
Sodium: 139 mmol/L (ref 135–145)

## 2020-05-05 LAB — SEDIMENTATION RATE: Sed Rate: 2 mm/hr (ref 0–16)

## 2020-05-05 LAB — D-DIMER, QUANTITATIVE: D-Dimer, Quant: 0.4 ug/mL-FEU (ref 0.00–0.50)

## 2020-05-05 LAB — BRAIN NATRIURETIC PEPTIDE: B Natriuretic Peptide: 38 pg/mL (ref 0.0–100.0)

## 2020-05-05 MED ORDER — FAMOTIDINE 20 MG PO TABS: ORAL_TABLET | ORAL | 11 refills | Status: AC

## 2020-05-05 MED ORDER — PANTOPRAZOLE SODIUM 40 MG PO TBEC: 40.0000 mg | DELAYED_RELEASE_TABLET | Freq: Every day | ORAL | 2 refills | Status: DC

## 2020-05-05 NOTE — Assessment & Plan Note (Signed)
Previous dx Es esophagitis by Dr Collene Mares with c/o dysphagia 05/05/2020  >>> 05/05/2020  rec gerd rx / consult DR Collene Mares          Each maintenance medication was reviewed in detail including emphasizing most importantly the difference between maintenance and prns and under what circumstances the prns are to be triggered using an action plan format where appropriate.  Total time for H and P, chart review, counseling,  and generating customized AVS unique to this office visit / same day charting  > 45 min

## 2020-05-05 NOTE — Telephone Encounter (Signed)
Per MW- ok for referral- Dr Juanita Craver  I placed urgent referral  Spoke with the pt's mother, Abigail Butts, ok per DPR and notified her of this  She verbalized understanding and nothing further needed

## 2020-05-05 NOTE — Progress Notes (Signed)
Benjamin Reed, male    DOB: 05-20-1985,     MRN: [id number]   Brief patient profile:  7 yowm quit smoking 01/2019 around 2017  Noted daily cough 30 min p stirring dark  Sometimes bloody rarely but gradually worse even p quit smoking (passive massive exp) without epistaxis referred to pulmonary clinic in [place]  05/05/2020 by Dr  Birdie Riddle      History of Present Illness  05/05/2020  Pulmonary/ 1st office eval/ Benjamin Reed / [facility]  Chief Complaint  Patient presents with  . Pulmonary Consult    Referred by Dr Annye Asa for eval of pulmonary nodules and DOE. Pt c/o DOE and cough for the past 4-5 years. He states having hemoptysis every day over the past 4-5 years. He states that he is SOB with exertion such as taking a shower.   Dyspnea:  Feels like every single time does foodlion has to go slow or feels like passing out x 4 y getting  Worse  Cough: feels better p clears mucus in back of throat/ mostly a daytime thing  Sleep: on Side / bed is flat  SABA use: none  Dr Collene Mares knows eos esophagitis says she rec albuterol???  NS x 3-4 m Groin swelling x 2.5 y R > L assoc with NS x 3-4 m    No obvious day to day or daytime variability or assoc excess/ purulent sputum or mucus plugs or   cp or chest tightness, subjective wheeze or overt sinus or hb symptoms.   Sleeping  without nocturnal  or early am exacerbation  of respiratory  c/o's or need for noct saba. Also denies any obvious fluctuation of symptoms with weather or environmental changes or other aggravating or alleviating factors except as outlined above   No unusual exposure hx or h/o childhood pna/ asthma or knowledge of premature birth.  Current Allergies, Complete Past Medical History, Past Surgical History, Family History, and Social History were reviewed in [employer] record.  ROS  The following are not active complaints unless bolded Hoarseness, sore throat, dysphagia, dental problems,  itching, sneezing,  nasal congestion or discharge of excess mucus or purulent secretions, ear ache,   fever, chills, sweats, unintended wt loss or wt gain, classically pleuritic or exertional cp,  orthopnea pnd or arm/hand swelling  or leg swelling, presyncope, palpitations, abdominal pain, anorexia, nausea, vomiting, diarrhea  or change in bowel habits or change in bladder habits, change in stools or change in urine, dysuria, hematuria,  rash, arthralgias, visual complaints, headache, numbness, weakness or ataxia or problems with walking or coordination,  change in mood or  memory.              Past Medical History:  Diagnosis Date  . Bronchitis   . 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 Chronic lower back pain    history of radiofrequency ablation  . Eosinophilic esophagitis   . Heartburn   . History of febrile seizure    as a child  . IBS (irritable bowel syndrome)    no current meds.  . Nasal congestion 12/09/2012    Outpatient Medications Prior to Visit  Medication Sig Dispense Refill  . albuterol (PROVENTIL HFA;VENTOLIN HFA) 108 (90 Base) MCG/ACT inhaler Inhale 1-2 puffs into the lungs every 6 (six) hours as needed for wheezing or shortness of breath. (Patient not taking: Reported on 05/05/2020) 1 Inhaler 0  . acetaminophen (TYLENOL) 500 MG tablet Take 1,000 mg by mouth every 6 (  six) hours as needed (pain).    . famotidine (PEPCID) 20 MG tablet Take 1 tablet (20 mg total) by mouth 2 (two) times daily. 60 tablet 1   No facility-administered medications prior to visit.     Objective:     BP 136/72 (BP Location: Left Arm, Cuff Size: Normal)   Pulse (!) 120   Temp (!) 97.1 F (36.2 C) (Temporal)   Ht 5' 9.75" (1.772 m)   Wt 136 lb (61.7 kg)   SpO2 99% Comment: on RA  BMI 19.65 kg/m   SpO2: 99 % (on RA)      Vital signs reviewed  05/05/2020  - Note at rest 02 sats  99% on RA    General appearance:   Unhealthy appearing amb anxious  wm nad   amb wf nad   HEENT : pt  wearing mask not removed for exam due to covid -19 concerns.    NECK :  without JVD/Nodes/TM/ nl carotid upstrokes bilaterally   LUNGS: no acc muscle use,  Nl contour chest which is clear to A and P bilaterally without cough on insp or exp maneuvers   CV:  RRR    Tach at 125 is perfectly regular no s3 or murmur or increase in P2, and no edema   ABD:  soft and nontender with nl inspiratory excursion in the supine position. No bruits or organomegaly appreciated, bowel sounds nl  MS:  Nl gait/ ext warm without deformities, calf tenderness, cyanosis or clubbing No obvious joint restrictions   SKIN: warm and dry without lesions    NEURO:  alert, approp, nl sensorium with  no motor or cerebellar deficits apparent.   Inguinal adenopathy is shoddy R > L not bulky     I personally reviewed images and agree with radiology impression as follows:   Chest CTa 03/25/20  1. No pulmonary embolism. 2. Clustered indistinct indistinct medial basilar right lower lobe pulmonary nodules, largest 4 mm. No follow-up required unless the patient has risk factors for lung malignancy.    Labs ordered/ reviewed:      Chemistry      Component Value Date/Time   NA 139 05/05/2020 1100   K 3.9 05/05/2020 1100   CL 102 05/05/2020 1100   CO2 28 05/05/2020 1100   BUN 13 05/05/2020 1100   CREATININE 0.86 05/05/2020 1100      Component Value Date/Time   CALCIUM 9.6 05/05/2020 1100   ALKPHOS 45 03/25/2020 1457   AST 19 03/25/2020 1457   ALT 17 03/25/2020 1457   BILITOT 0.9 03/25/2020 1457      u/a 05/05/2020   Neg for hgb  Lab Results  Component Value Date   WBC 5.6 05/05/2020   HGB 15.4 05/05/2020   HCT 46.2 05/05/2020   MCV 90.2 05/05/2020   PLT 228 05/05/2020       EOS                                                               0.1                                    05/05/2020   Lab Results  Component Value  Date   DDIMER 0.40 05/05/2020      Lab Results  Component Value Date   TSH  1.47 03/23/2020    BNP   05/05/2020     =   38       Lab Results  Component Value Date   ESRSEDRATE 2 05/05/2020   ESRSEDRATE 1 03/21/2010   Labs ordered 05/05/2020  :  allergy profile   alpha one AT phenotype  ACE level, ANCA screen FV leiden    Assessment   DOE (dyspnea on exertion) Onset around 2017 with chronic cough - unexplained tachycardia at ov 05/05/2020 ? Surreptitious drug use?   Symptoms are markedly disproportionate to objective findings and not clear to what extent this is actually a pulmonary  problem but pt does appear to have difficult to sort out respiratory symptoms of unknown origin for which  DDX  = almost all start with A and  include Adherence, Ace Inhibitors, Acid Reflux, Active Sinus Disease, Alpha 1 Antitripsin deficiency, Anxiety masquerading as Airways dz,  ABPA,  Allergy(esp in young), Aspiration (esp in elderly), Adverse effects of meds,  Active smoking or Vaping, A bunch of PE's/clot burden (a few small clots can't cause this syndrome unless there is already severe underlying pulm or vascular dz with poor reserve),  Anemia or thyroid disorder, plus two Bs  = Bronchiectasis and Beta blocker use..and one C= CHF   Anemia/ thyroid dz excluded  A bunch of PE's D dimer nl - while a normal  or high normal value (seen commonly in the elderly or chronically ill)  may miss small peripheral pe, the clot burden with sob is moderately high and the d dimer  has a very high neg pred value if used in this setting.    ? CHF > uneplained assoc tachycardia appears to be sinus Tach in setting of systemic illness but esr not elevated to vasculitis like WG unlikely but can't rule our sarcoid or lymphoma here (absence of med notes makes the form extremely unlikely though  rec >>>if w/u neg consider inginal node bx         Dysphagia Previous dx Es esophagitis by Dr Collene Mares with c/o dysphagia 05/05/2020  >>> 05/05/2020  rec gerd rx / consult DR Collene Mares          Each maintenance  medication was reviewed in detail including emphasizing most importantly the difference between maintenance and prns and under what circumstances the prns are to be triggered using an action plan format where appropriate.  Total time for H and P, chart review, counseling,  and generating customized AVS unique to this office visit / same day charting  > 45 min           Christinia Gully, MD 05/05/2020

## 2020-05-05 NOTE — Patient Instructions (Signed)
Avoid all cigarette exposure  Make sure you check your oxygen saturation at your highest level of activity to be sure it stays over 90% and keep track of it at least once a week, more often if breathing getting worse, and let me know if losing ground.   Please call Dr Collene Mares asap for appt regarding your swallowing   Pantoprazole (protonix) 40 mg   Take  30-60 min before first meal of the day and Pepcid (famotidine)  20 mg one @  bedtime until return to office - this is the best way to tell whether stomach acid is contributing to your problem.    GERD (REFLUX)  is an extremely common cause of respiratory symptoms just like yours , many times with no obvious heartburn at all.    It can be treated with medication, but also with lifestyle changes including elevation of the head of your bed (ideally with 6 -8inch blocks under the headboard of your bed),  Smoking cessation, avoidance of late meals, excessive alcohol, and avoid fatty foods, chocolate, peppermint, colas, red wine, and acidic juices such as orange juice.  NO MINT OR MENTHOL PRODUCTS SO NO COUGH DROPS  USE SUGARLESS CANDY INSTEAD (Jolley ranchers or Stover's or Life Savers) or even ice chips will also do - the key is to swallow to prevent all throat clearing. NO OIL BASED VITAMINS - use powdered substitutes.  Avoid fish oil when coughing.    Please remember to go to the lab department @ [facility] for your tests - we will call you with the results when they are available.       Please schedule a follow up office visit in 4 weeks, sooner if needed

## 2020-05-05 NOTE — Assessment & Plan Note (Signed)
Onset around 2017 with chronic cough - unexplained tachycardia at ov 05/05/2020 ? Surreptitious drug use?   Symptoms are markedly disproportionate to objective findings and not clear to what extent this is actually a pulmonary  problem but pt does appear to have difficult to sort out respiratory symptoms of unknown origin for which  DDX  = almost all start with A and  include Adherence, Ace Inhibitors, Acid Reflux, Active Sinus Disease, Alpha 1 Antitripsin deficiency, Anxiety masquerading as Airways dz,  ABPA,  Allergy(esp in young), Aspiration (esp in elderly), Adverse effects of meds,  Active smoking or Vaping, A bunch of PE's/clot burden (a few small clots can't cause this syndrome unless there is already severe underlying pulm or vascular dz with poor reserve),  Anemia or thyroid disorder, plus two Bs  = Bronchiectasis and Beta blocker use..and one C= CHF   Anemia/ thyroid dz excluded  A bunch of PE's D dimer nl - while a normal  or high normal value (seen commonly in the elderly or chronically ill)  may miss small peripheral pe, the clot burden with sob is moderately high and the d dimer  has a very high neg pred value if used in this setting.    ? CHF > uneplained assoc tachycardia appears to be sinus Tach in setting of systemic illness but esr not elevated to vasculitis like WG unlikely but can't rule our sarcoid or lymphoma here (absence of med notes makes the form extremely unlikely though  rec >>>if w/u neg consider inginal node bx        

## 2020-05-06 LAB — ANGIOTENSIN CONVERTING ENZYME: Angiotensin-Converting Enzyme: 47 U/L (ref 14–82)

## 2020-05-09 ENCOUNTER — Telehealth: Payer: Self-pay | Admitting: Internal Medicine

## 2020-05-09 DIAGNOSIS — R1310 Dysphagia, unspecified: Secondary | ICD-10-CM

## 2020-05-09 DIAGNOSIS — K219 Gastro-esophageal reflux disease without esophagitis: Secondary | ICD-10-CM

## 2020-05-09 LAB — ALPHA-1 ANTITRYPSIN PHENOTYPE: A-1 Antitrypsin, Ser: 149 mg/dL (ref 95–164)

## 2020-05-09 NOTE — Telephone Encounter (Signed)
 GI  Dx GERD

## 2020-05-09 NOTE — Telephone Encounter (Signed)
MW pt/pts mother is requesting that another referral to a different GI doctor be placed as Dr. Lorie Apley office does not take his insurance.   Please advise. Thanks

## 2020-05-09 NOTE — Telephone Encounter (Signed)
Called and spoke with pt's mother Benjamin Reed letting her know that we were going to place a new GI referral for pt to [facility] and she verbalized understanding. She said that the prior order had been placed as urgent and I stated to her that we could place in there for this referral to be ASAP as well and she verbalized understanding. Order has been placed. Nothing further needed.

## 2020-05-12 ENCOUNTER — Other Ambulatory Visit: Payer: 59

## 2020-05-12 ENCOUNTER — Encounter: Payer: Self-pay | Admitting: Nurse Practitioner

## 2020-05-12 ENCOUNTER — Ambulatory Visit (INDEPENDENT_AMBULATORY_CARE_PROVIDER_SITE_OTHER): Payer: 59 | Admitting: Nurse Practitioner

## 2020-05-12 VITALS — BP 124/70 | HR 88 | Ht 69.0 in | Wt 137.5 lb

## 2020-05-12 DIAGNOSIS — R197 Diarrhea, unspecified: Secondary | ICD-10-CM | POA: Diagnosis not present

## 2020-05-12 DIAGNOSIS — K625 Hemorrhage of anus and rectum: Secondary | ICD-10-CM | POA: Diagnosis not present

## 2020-05-12 DIAGNOSIS — R1310 Dysphagia, unspecified: Secondary | ICD-10-CM

## 2020-05-12 DIAGNOSIS — K200 Eosinophilic esophagitis: Secondary | ICD-10-CM | POA: Diagnosis not present

## 2020-05-12 LAB — IGE: IgE (Immunoglobulin E), Serum: 23 IU/mL (ref 6–495)

## 2020-05-12 LAB — FACTOR 5 LEIDEN

## 2020-05-12 MED ORDER — OMEPRAZOLE 40 MG PO CPDR: 40.0000 mg | DELAYED_RELEASE_CAPSULE | Freq: Two times a day (BID) | ORAL | 2 refills | Status: DC
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
## 2020-05-12 NOTE — Patient Instructions (Addendum)
If you are age 33 or younger, your body mass index should be between 19-25. Your Body mass index is 20.31 kg/m. If this is out of the aformentioned range listed, please consider follow up with your Primary Care Provider.   PROCEDURES: You have been scheduled for an endoscopy and colonoscopy. Please follow the written instructions given to you at your visit today. Please pick up your prep supplies at the pharmacy within the next 1-3 days. If you use inhalers (even only as needed), please bring them with you on the day of your procedure.  LABS:  A stool test has been ordered for you today. Please go to the lab and pick up the kit to take home. Our lab is located in the basement Press "B" on the elevator. The lab is located at the first door on the left as you exit the elevator.  HEALTHCARE LAWS AND MY CHART RESULTS: Due to recent changes in healthcare laws, you may see the results of your imaging and laboratory studies on MyChart before your provider has had a chance to review them.   We understand that in some cases there may be results that are confusing or concerning to you. Not all laboratory results come back in the same time frame and the provider may be waiting for multiple results in order to interpret others.  Please give [place] 48 hours in order for your provider to thoroughly review all the results before contacting the office for clarification of your results.   MEDICATION: We have sent the following medication to your pharmacy for you to pick up at your convenience: Omeprazole 40 MG, take 1 tablet twice a day.  OVER THE COUNTER MEDICATION Please purchase the following medications over the counter and take as directed:  Probiotic of your choice, take it twice a day.  We will obtain pulmonary clearance prior to your procedures.  It was great seeing you today! Thank you for entrusting me with your care and choosing [facility].  Noralyn Pick,  CRNP

## 2020-05-16 ENCOUNTER — Encounter: Payer: Self-pay | Admitting: *Deleted

## 2020-05-18 ENCOUNTER — Telehealth: Payer: Self-pay

## 2020-05-18 NOTE — Telephone Encounter (Signed)
Request for surgical clearance:     Endoscopy Procedure  What type of surgery is being performed?     Colonoscopy and EGD  When is this surgery scheduled?     06/15/20  What type of clearance is required ?  -Pulmonary clearance prior to proceeding with an EGD/Colonoscopy  Practice name and name of physician performing surgery?      [facility]  What is your office phone and fax number?      Phone- [phone]  Fax662 [phone]  Anesthesia type (None, local, MAC, general) ?       MAC

## 2020-05-18 NOTE — Telephone Encounter (Signed)
Melissa, great news. Pls inform patient we received pulmonary clearance from Dr. Melvyn Novas, he should proceed with his EGD and colonoscopy 06/15/2020 as scheduled. Patient to call our office if his symptoms worsen prior to his procedure date. Thx

## 2020-05-18 NOTE — Telephone Encounter (Signed)
Pt is cleared for procedures listed from a pulmonary perspective

## 2020-05-24 NOTE — Progress Notes (Signed)
Agree with the assessment and plan as outlined by Colleen Kennedy-Smith, NP.   Karlissa Aron, DO, FACG [facility]   

## 2020-05-25 ENCOUNTER — Other Ambulatory Visit: Payer: 59

## 2020-05-25 DIAGNOSIS — K200 Eosinophilic esophagitis: Secondary | ICD-10-CM

## 2020-05-25 DIAGNOSIS — R197 Diarrhea, unspecified: Secondary | ICD-10-CM

## 2020-05-25 DIAGNOSIS — K625 Hemorrhage of anus and rectum: Secondary | ICD-10-CM

## 2020-05-25 DIAGNOSIS — R1310 Dysphagia, unspecified: Secondary | ICD-10-CM

## 2020-05-28 ENCOUNTER — Other Ambulatory Visit: Payer: Self-pay | Admitting: Nurse Practitioner

## 2020-05-28 LAB — GI PROFILE, STOOL, PCR

## 2020-05-28 MED ORDER — VANCOMYCIN HCL 125 MG PO CAPS: 125.0000 mg | ORAL_CAPSULE | Freq: Four times a day (QID) | ORAL | 0 refills | Status: DC

## 2020-05-30 ENCOUNTER — Other Ambulatory Visit: Payer: Self-pay

## 2020-05-30 MED ORDER — VANCOMYCIN HCL 125 MG PO CAPS: 125.0000 mg | ORAL_CAPSULE | Freq: Four times a day (QID) | ORAL | 0 refills | Status: AC

## 2020-05-30 NOTE — Telephone Encounter (Signed)
Beth, see patient msg below. He is referring to Vancomycin RX. Can you resend the Vanco RX to the pharmacy he listed below, if not affordable do you know of other pharmacies that are lower cost? Sometimes the liquid po form of Vanco is less expensive so we could try that if feasible.  If Vanco not affordable I will consider treatment with Flagyl. Let me know outcome. Thx.

## 2020-06-02 ENCOUNTER — Encounter: Payer: Self-pay | Admitting: Internal Medicine

## 2020-06-02 ENCOUNTER — Ambulatory Visit (INDEPENDENT_AMBULATORY_CARE_PROVIDER_SITE_OTHER): Payer: 59 | Admitting: Internal Medicine

## 2020-06-02 ENCOUNTER — Other Ambulatory Visit: Payer: Self-pay

## 2020-06-02 DIAGNOSIS — R0609 Other forms of dyspnea: Secondary | ICD-10-CM

## 2020-06-02 DIAGNOSIS — R0600 Dyspnea, unspecified: Secondary | ICD-10-CM

## 2020-06-02 NOTE — Progress Notes (Signed)
Benjamin Reed, male    DOB: [birth year],     MRN: [id number]   Brief patient profile:  89 yowm quit smoking 01/2019 around 2017  Noted daily cough 30 min p stirring in am >  dark  Blood  rarely but gradually worse even p quit smoking (just ongoing passive massive exp) without epistaxis referred to pulmonary clinic in [place]  05/05/2020 by Dr  Birdie Riddle      History of Present Illness  05/05/2020  Pulmonary/ 1st office eval/ Benjamin Reed / [facility] Reed [facility]  Chief Complaint  Patient presents with  . Pulmonary Consult    Referred by Dr Annye Asa for eval of pulmonary nodules and DOE. Pt c/o DOE and cough for the past 4-5 years. He states having hemoptysis every day over the past 4-5 years. He states that he is SOB with exertion such as taking a shower.   Dyspnea:  Feels like every single time does foodlion has to go slow or feels like passing out x 4 y getting  Worse  Cough: feels better p clears mucus in back of throat/ mostly a daytime thing  Sleep: on Side / bed is flat  SABA use: none  Sees Dr Collene Mares knows dx =  eos esophagitis says she rec albuterol???  NS x 3-4 m Groin swelling x 2.5 y R > L assoc with NS x 3-4 m  rec Avoid all cigarette exposure Make sure you check your oxygen saturation at your highest level of activity Please call Dr Collene Mares asap for appt regarding your swallowing  Pantoprazole (protonix) 40 mg   Take  30-60 min before first meal of the day and Pepcid (famotidine)  20 mg one @  bedtime until return to office  GERD diet   Please remember to go to the lab department @ [facility]  :  All wnl with ESR 2/ neg [facility] leiden   06/02/2020  f/u ov/Benjamin Reed office/Benjamin Reed re: unexplained sob/ coughing up black mucus / chokes frequently   Chief Complaint  Patient presents with  . Follow-up    Breathing is unchanged since the last visit. He states that his cough is also no better and he is coughing up black sputum daily.   Dyspnea:  Room to room says sats dropping to  89%  Cough: daily sensation of throat congestion wakes him tsp white with black specks Sleeping: bed is flat / not using bed blocks as rec, taking ppi at hs  SABA use: none  02: none     No obvious day to day or daytime variability or assoc   mucus plugs or frank hemoptysis or cp or chest tightness, subjective wheeze or overt sinus or hb symptoms.     Also denies any obvious fluctuation of symptoms with weather or environmental changes or other aggravating or alleviating factors except as outlined above   No unusual exposure hx or h/o childhood pna/ asthma or knowledge of premature birth.  Current Allergies, Complete Past Medical History, Past Surgical History, Family History, and Social History were reviewed in [employer] record.  ROS  The following are not active complaints unless bolded Hoarseness, sore throat, dysphagia, dental problems, itching, sneezing,  nasal congestion or discharge of excess mucus or purulent secretions, ear ache,   fever, chills, sweats, unintended wt loss or wt gain, classically pleuritic or exertional cp,  orthopnea pnd or arm/hand swelling  or leg swelling, presyncope, palpitations, abdominal pain, anorexia, nausea, vomiting, diarrhea  or change  in bowel habits or change in bladder habits, change in stools or change in urine, dysuria, hematuria,  rash, arthralgias, visual complaints, headache, numbness, weakness or ataxia or problems with walking or coordination,  change in mood or  memory.        Current Meds  Medication Sig  . acetaminophen (TYLENOL) 500 MG tablet Take 500-1,000 mg by mouth as needed.  . cetirizine (ZYRTEC) 10 MG tablet Take 10 mg by mouth daily.  . Cholecalciferol (VITAMIN D3 PO) Take 1 tablet by mouth daily.  . famotidine (PEPCID) 20 MG tablet One after supper  . omeprazole (PRILOSEC) 40 MG capsule Take 1 capsule (40 mg total) by mouth in the morning and at bedtime.  . vancomycin (VANCOCIN) 125 MG capsule Take 1  capsule (125 mg total) by mouth 4 (four) times daily for 14 days.  . vitamin C (ASCORBIC ACID) 500 MG tablet Take 500 mg by mouth daily.                      Past Medical History:  Diagnosis Date  . Bronchitis   . 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 Chronic lower back pain    history of radiofrequency ablation  . Eosinophilic esophagitis   . Heartburn   . History of febrile seizure    as a child  . IBS (irritable bowel syndrome)    no current meds.  . Nasal congestion 12/09/2012       Objective:       Wt Readings from Last 3 Encounters:  06/02/20 135 lb (61.2 kg)  05/12/20 137 lb 8 oz (62.4 kg)  05/05/20 136 lb (61.7 kg)      Vital signs reviewed  06/02/2020  - Note at rest 02 sats  100% on RA   General appearance:    amb thin wm nad      HEENT : pt wearing mask not removed for exam due to covid -19 concerns.    NECK :  without JVD/Nodes/TM/ nl carotid upstrokes bilaterally   LUNGS: no acc muscle use,  Nl contour chest which is clear to A and P bilaterally without cough on insp or exp maneuvers   CV:  RRR  no s3 or murmur or increase in P2, and no edema   ABD:  soft and nontender with nl inspiratory excursion in the supine position. No bruits or organomegaly appreciated, bowel sounds nl  MS:  Nl gait/ ext warm without deformities, calf tenderness, cyanosis or clubbing No obvious joint restrictions   SKIN: warm and dry without lesions    NEURO:  alert, approp, nl sensorium with  no motor or cerebellar deficits apparent.       CXR PA and Lateral:   06/02/2020 :    I personally reviewed images and agree with radiology impression as follows:     Did not go for cxr as rec      Assessment

## 2020-06-02 NOTE — Assessment & Plan Note (Addendum)
Onset around 2017 with chronic cough - unexplained tachycardia at ov 05/05/2020 ? Surreptitious drug use?  - Alpha one AT  05/05/20  MM  149  - Factor V Leiden 05/05/20  Neg  -  06/02/2020   Walked RA  approx   500 ft  @ fast pace  stopped due to end of study, sats still 100% and pulse 134 no sob   Symptoms are markedly disproportionate to objective findings and not clear to what extent this is actually a pulmonary  problem but pt does appear to have difficult to sort out respiratory symptoms of unknown origin for which  DDX  = almost all start with A and  include Adherence, Ace Inhibitors, Acid Reflux, Active Sinus Disease, Alpha 1 Antitripsin deficiency, Anxiety masquerading as Airways dz,  ABPA,  Allergy(esp in young), Aspiration (esp in elderly), Adverse effects of meds,  Active smoking or Vaping, A bunch of PE's/clot burden (a few small clots can't cause this syndrome unless there is already severe underlying pulm or vascular dz with poor reserve),  Anemia or thyroid disorder, plus two Bs  = Bronchiectasis and Beta blocker use..and one C= CHF    Strongly suspect gerd ? Aspiration noct assoc with severe chronic dysphagia and have rec:  Add bed blocks as rec  Add 1st gen H1 blockers per guidelines  For pnds/noct cough  Change ppi to bid ac and continue pepcid 20 mg hs Complete the gi w/u planned Cxr > did not go to xray as requested   Ov in 6 weeks   Each maintenance medication was reviewed in detail including emphasizing most importantly the difference between maintenance and prns and under what circumstances the prns are to be triggered using an action plan format where appropriate.  Total time for H and P, chart review, counseling,  directly observing portions of ambulatory 02 saturation study/ and generating customized AVS unique to this office visit / same day charting  > 30 min

## 2020-06-02 NOTE — Patient Instructions (Addendum)
Avoid all cigarette exposure  Make sure you check your oxygen saturation at your highest level of activity to be sure it stays over 90% and keep track of it at least once a week, more often if breathing getting worse, and let me know if losing ground.     GERD (REFLUX)  is an extremely common cause of respiratory symptoms just like yours , many times with no obvious heartburn at all.    It can be treated with medication, but also with lifestyle changes including elevation of the head of your bed (ideally with 6 -8inch blocks under the headboard of your bed),  Smoking cessation, avoidance of late meals, excessive alcohol, and avoid fatty foods, chocolate, peppermint, colas, red wine, and acidic juices such as orange juice.  NO MINT OR MENTHOL PRODUCTS SO NO COUGH DROPS  USE SUGARLESS CANDY INSTEAD (Jolley ranchers or Stover's or Life Savers) or even ice chips will also do - the key is to swallow to prevent all throat clearing. NO OIL BASED VITAMINS - use powdered substitutes.  Avoid fish oil when coughing.    Change omeprazole to take it Take 30- 60 min before your first and last meals of the day and pepcid 20 mg one daily  If not improving sleeping at night ok to try  chlorpheniramine 4 mg  One or two an hour before bedtime  (chlortabs at [facility])    Please schedule a follow up office visit in 6 weeks, call sooner if needed            Please schedule a follow up office visit in 4 weeks, sooner if needed

## 2020-06-08 ENCOUNTER — Telehealth: Payer: Self-pay | Admitting: Nurse Practitioner

## 2020-06-08 NOTE — Telephone Encounter (Signed)
I called the patient for symptom update on Vanco for C. Diff.   He passed soft stool on Sunday with a small amount of bright red blood.  Yesterday he passed several softer stools without blood.  Today he passed a small amount of soft stool and later passed only blood per the rectum.  He continues to feel bloated.  He remains on vancomycin 125 mg p.o. 4 times daily for C. difficile.  I asked the patient to call me in the next day or 2 if he has further rectal bleeding or if his abdominal pain or diarrhea worsens.  He will proceed with an EGD and colonoscopy 5/18/202 as scheduled, however, if his symptoms worsen or if he has significant hematochezia he may require hospital admission.

## 2020-06-15 ENCOUNTER — Ambulatory Visit (AMBULATORY_SURGERY_CENTER): Payer: 59 | Admitting: Gastroenterology

## 2020-06-15 ENCOUNTER — Encounter: Payer: Self-pay | Admitting: Gastroenterology

## 2020-06-15 ENCOUNTER — Other Ambulatory Visit: Payer: Self-pay

## 2020-06-15 VITALS — BP 102/72 | HR 82 | Temp 98.2°F | Resp 21 | Ht 69.0 in | Wt 137.0 lb

## 2020-06-15 DIAGNOSIS — K222 Esophageal obstruction: Secondary | ICD-10-CM

## 2020-06-15 DIAGNOSIS — K200 Eosinophilic esophagitis: Secondary | ICD-10-CM

## 2020-06-15 DIAGNOSIS — K625 Hemorrhage of anus and rectum: Secondary | ICD-10-CM | POA: Diagnosis not present

## 2020-06-15 DIAGNOSIS — D125 Benign neoplasm of sigmoid colon: Secondary | ICD-10-CM | POA: Diagnosis not present

## 2020-06-15 DIAGNOSIS — R1310 Dysphagia, unspecified: Secondary | ICD-10-CM | POA: Diagnosis not present

## 2020-06-15 DIAGNOSIS — R197 Diarrhea, unspecified: Secondary | ICD-10-CM

## 2020-06-15 DIAGNOSIS — K648 Other hemorrhoids: Secondary | ICD-10-CM | POA: Diagnosis not present

## 2020-06-15 DIAGNOSIS — K449 Diaphragmatic hernia without obstruction or gangrene: Secondary | ICD-10-CM

## 2020-06-15 DIAGNOSIS — K640 First degree hemorrhoids: Secondary | ICD-10-CM

## 2020-06-15 MED ORDER — DICYCLOMINE HCL 10 MG PO CAPS: 10.0000 mg | ORAL_CAPSULE | ORAL | 3 refills | Status: DC | PRN

## 2020-06-15 MED ORDER — SODIUM CHLORIDE 0.9 % IV SOLN: 500.0000 mL | Freq: Once | INTRAVENOUS | Status: DC

## 2020-06-15 NOTE — Op Note (Signed)
[facility] Patient Name: Benjamin Reed Procedure Date: 06/15/2020 2:45 PM MRN: [id number] Endoscopist: Gerrit Heck , MD Age: 35 Referring MD:  Date of Birth: 1985/05/29 Gender: Male Account #: [id number] Procedure:                Colonoscopy Indications:              Lower abdominal pain, Hematochezia, Change in bowel                            habits, Diarrhea                           35 yo male with diarrhea, loose stools,                            intermittent hematochezia, and lower abdominal                            pain. Recent treated with Vancomycin for C diff+.                            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 He stated he was                            diagnosed with an anal fissure in 2013 and he                            underwent hemorrhoid surgery in 2012. Medicines:                Monitored Anesthesia Care Procedure:                Pre-Anesthesia Assessment:                           - Prior to the procedure, a History and Physical                            was performed, and patient medications and                            allergies were reviewed. The patient's tolerance of                            previous anesthesia was also reviewed. The risks                            and benefits of the procedure and the sedation                            options and risks were discussed with the patient.  All questions were answered, and informed consent                            was obtained. Prior Anticoagulants: The patient has                            taken no previous anticoagulant or antiplatelet                            agents. ASA Grade Assessment: II - A patient with                            mild systemic disease. After reviewing the risks                            and benefits, the patient was deemed in                             satisfactory condition to undergo the procedure.                           After obtaining informed consent, the colonoscope                            was passed under direct vision. Throughout the                            procedure, the patient's blood pressure, pulse, and                            oxygen saturations were monitored continuously. The                            Olympus PFC-H190DL [id number]) Colonoscope was                            introduced through the anus and advanced to the the                            cecum, identified by appendiceal orifice and                            ileocecal valve. The colonoscopy was performed                            without difficulty. The patient tolerated the                            procedure well. The quality of the bowel                            preparation was good. The ileocecal valve,  appendiceal orifice, and rectum were photographed. Scope In: 3:13:40 PM Scope Out: 3:35:58 PM Scope Withdrawal Time: 0 hours 18 minutes 36 seconds  Total Procedure Duration: 0 hours 22 minutes 18 seconds  Findings:                 The perianal and digital rectal examinations were                            normal.                           A 5 mm polyp was found in the sigmoid colon. The                            polyp was sessile. The polyp was removed with a                            cold snare. Resection and retrieval were complete.                            Estimated blood loss was minimal.                           Normal mucosa was found in the entire colon.                            Biopsies for histology were taken with a cold                            forceps from the right colon and left colon for                            evaluation of microscopic colitis. Estimated blood                            loss was minimal.                           Retroflexion in the rectum was not performed due  to                            anatomy (narrowed rectal vault).                           A scar was found in the distal rectum consistent                            with prior hemorrhoid surgery. The scar tissue was                            healthy in appearance.                           Non-bleeding internal hemorrhoids were found during  anoscopy. The hemorrhoids were small. Complications:            No immediate complications. Estimated Blood Loss:     Estimated blood loss was minimal. Impression:               - One 5 mm polyp in the sigmoid colon, removed with                            a cold snare. Resected and retrieved.                           - Normal mucosa in the entire examined colon.                            Biopsied.                           - Scar in the distal rectum consistent with prior                            hemorrhoid surgery.                           - Non-bleeding internal hemorrhoids. Recommendation:           - Patient has a contact number available for                            emergencies. The signs and symptoms of potential                            delayed complications were discussed with the                            patient. Return to normal activities tomorrow.                            Written discharge instructions were provided to the                            patient.                           - Resume previous diet.                           - Continue present medications.                           - Await pathology results.                           - Repeat colonoscopy for surveillance based on                            pathology results.                           -  Return to GI clinic at appointment to be                            scheduled.                           - Use fiber, for example Citrucel, Fibercon, Konsyl                            or Metamucil.                           - Internal  hemorrhoids were noted on this study and                            may be amenable to hemorrhoid band ligation. If you                            are interested in further treatment of these                            hemorrhoids with band ligation, please contact my                            clinic to set up an appointment for evaluation and                            treatment. Gerrit Heck, MD 06/15/2020 3:57:19 PM

## 2020-06-15 NOTE — Progress Notes (Signed)
PT taken to PACU. Monitors in place. VSS. Report given to RN. 

## 2020-06-15 NOTE — Progress Notes (Signed)
[facility] 

## 2020-06-15 NOTE — Op Note (Signed)
[facility] Patient Name: Benjamin Reed Procedure Date: 06/15/2020 2:48 PM MRN: [id number] Endoscopist: Gerrit Heck , MD Age: 35 Referring MD:  Date of Birth: 06/02/1985 Gender: Male Account #: [id number] Procedure:                Upper GI endoscopy Indications:              Dysphagia, Suspected esophageal reflux, Diarrhea                           35 yo male with a history of eosinophilic                            esophagitis diagnosed during EGD while in the [employer]                            in 2009. He took an inhaler for several years                            without significant improvement. He underwent a                            second EGD in 2012 by Dr. Collene Mares. He now presents                            with progressive dysphagia, along with reflux                            symptoms, and loose stools/diarrhea. Recently                            increased PPI from omeprazole 20 mg BID to 40 mg                            BID. Medicines:                Monitored Anesthesia Care Procedure:                Pre-Anesthesia Assessment:                           - Prior to the procedure, a History and Physical                            was performed, and patient medications and                            allergies were reviewed. The patient's tolerance of                            previous anesthesia was also reviewed. The risks                            and benefits of the procedure and the sedation  options and risks were discussed with the patient.                            All questions were answered, and informed consent                            was obtained. Prior Anticoagulants: The patient has                            taken no previous anticoagulant or antiplatelet                            agents. ASA Grade Assessment: II - A patient with                            mild systemic disease. After reviewing the risks                             and benefits, the patient was deemed in                            satisfactory condition to undergo the procedure.                           After obtaining informed consent, the endoscope was                            passed under direct vision. Throughout the                            procedure, the patient's blood pressure, pulse, and                            oxygen saturations were monitored continuously. The                            Endoscope was introduced through the mouth, and                            advanced to the second part of duodenum. The upper                            GI endoscopy was accomplished without difficulty.                            The patient tolerated the procedure well. Scope In: Scope Out: Findings:                 One benign-appearing, intrinsic mild stenosis was                            found 39 cm from the incisors. This stenosis  measured less than one cm (in length). The stenosis                            was traversed. A TTS dilator was passed through the                            scope. Dilation with an 18-19-20 mm balloon dilator                            was performed to 20 mm. The dilation site was                            examined and showed no bleeding, mucosal tear or                            perforation. The balloon was reinflated to 20 mm                            and dragged proximally through the esophagus and                            deflated at the UES. The esophagus was re-examined                            and no mucosal breaks noted in the mid or distal                            esophagus either. The distal stricture was then                            biopsied with a cold forceps for fracturing of the                            ring (no path specimen). Estimated blood loss was                            minimal.                           The upper third of the esophagus,  middle third of                            the esophagus and lower third of the esophagus were                            normal. Biopsies were obtained from the proximal                            and distal esophagus with cold forceps for                            histology of suspected eosinophilic esophagitis.  Estimated blood loss was minimal.                           The Z-line was regular and was found 40 cm from the                            incisors.                           A small sliding-type hiatal hernia was present.                           The gastroesophageal flap valve was visualized                            endoscopically and classified as Hill Grade III                            (minimal fold, loose to endoscope, hiatal hernia                            likely).                           The entire examined stomach was normal. Biopsies                            were taken with a cold forceps for Helicobacter                            pylori testing. Estimated blood loss was minimal.                           The examined duodenum was normal. Biopsies were                            taken with a cold forceps for histology. Estimated                            blood loss was minimal. Complications:            No immediate complications. Estimated Blood Loss:     Estimated blood loss was minimal. Impression:               - Benign-appearing esophageal stenosis. Dilated                            with 20 mm TTS balloon then fractured with cold                            forceps.                           - Normal upper third of esophagus, middle third of  esophagus and lower third of esophagus. Biopsied.                           - Z-line regular, 40 cm from the incisors.                           - Small hiatal hernia.                           - Gastroesophageal flap valve classified as Hill                             Grade III (minimal fold, loose to endoscope, hiatal                            hernia likely).                           - Normal stomach. Biopsied.                           - Normal examined duodenum. Biopsied. Recommendation:           - Patient has a contact number available for                            emergencies. The signs and symptoms of potential                            delayed complications were discussed with the                            patient. Return to normal activities tomorrow.                            Written discharge instructions were provided to the                            patient.                           - Resume previous diet.                           - Continue present medications.                           - Await pathology results.                           - Return to GI clinic at appointment to be                            scheduled. Gerrit Heck, MD 06/15/2020 3:51:35 PM

## 2020-06-15 NOTE — Progress Notes (Signed)
Called to room to assist during endoscopic procedure.  Patient ID and intended procedure confirmed with present staff. Received instructions for my participation in the procedure from the performing physician.  

## 2020-06-15 NOTE — Patient Instructions (Signed)
YOU HAD AN ENDOSCOPIC PROCEDURE TODAY AT THE [facility]:   Refer to the procedure report that was given to you for any specific questions about what was found during the examination.  If the procedure report does not answer your questions, please call your gastroenterologist to clarify.  If you requested that your care partner not be given the details of your procedure findings, then the procedure report has been included in a sealed envelope for you to review at your convenience later.  YOU SHOULD EXPECT: Some feelings of bloating in the abdomen. Passage of more gas than usual.  Walking can help get rid of the air that was put into your GI tract during the procedure and reduce the bloating. If you had a lower endoscopy (such as a colonoscopy or flexible sigmoidoscopy) you may notice spotting of blood in your stool or on the toilet paper. If you underwent a bowel prep for your procedure, you may not have a normal bowel movement for a few days.  Please Note:  You might notice some irritation and congestion in your nose or some drainage.  This is from the oxygen used during your procedure.  There is no need for concern and it should clear up in a day or so.  SYMPTOMS TO REPORT IMMEDIATELY:   Following lower endoscopy (colonoscopy or flexible sigmoidoscopy):  Excessive amounts of blood in the stool  Significant tenderness or worsening of abdominal pains  Swelling of the abdomen that is new, acute  Fever of 100F or higher   Following upper endoscopy (EGD)  Vomiting of blood or coffee ground material  New chest pain or pain under the shoulder blades  Painful or persistently difficult swallowing  New shortness of breath  Fever of 100F or higher  Black, tarry-looking stools  For urgent or emergent issues, a gastroenterologist can be reached at any hour by calling [phone]. Do not use MyChart messaging for urgent concerns.    DIET:  We do recommend a small meal at first, but  then you may proceed to your regular diet.  Drink plenty of fluids but you should avoid alcoholic beverages for 24 hours.  ACTIVITY:  You should plan to take it easy for the rest of today and you should NOT DRIVE or use heavy machinery until tomorrow (because of the sedation medicines used during the test).    FOLLOW UP: Our staff will call the number listed on your records 48-72 hours following your procedure to check on you and address any questions or concerns that you may have regarding the information given to you following your procedure. If we do not reach you, we will leave a message.  We will attempt to reach you two times.  During this call, we will ask if you have developed any symptoms of COVID 19. If you develop any symptoms (ie: fever, flu-like symptoms, shortness of breath, cough etc.) before then, please call [phone].  If you test positive for Covid 19 in the 2 weeks post procedure, please call and report this information to us.    If any biopsies were taken you will be contacted by phone or by letter within the next 1-3 weeks.  Please call us at [phone] if you have not heard about the biopsies in 3 weeks.    SIGNATURES/CONFIDENTIALITY: You and/or your care partner have signed paperwork which will be entered into your electronic medical record.  These signatures attest to the fact that that the information above on   your After Visit Summary has been reviewed and is understood.  Full responsibility of the confidentiality of this discharge information lies with you and/or your care-partner. 

## 2020-06-16 ENCOUNTER — Telehealth: Payer: Self-pay

## 2020-06-16 NOTE — Telephone Encounter (Signed)
Per 06/15/20 procedure note - Return to GI office at appt to be scheduled.  Spoke with patient and he is scheduled for a follow up with Dr. Bryan Lemma on Monday, 07/25/20 at 9:40 AM. Patient had no concerns at the end of the call.

## 2020-06-17 ENCOUNTER — Telehealth: Payer: Self-pay

## 2020-06-17 NOTE — Telephone Encounter (Signed)
  Follow up Call-  Call back number 06/15/2020  Post procedure Call Back phone  # [phone]  Permission to leave phone message Yes  Some recent data might be hidden     Patient questions:  Do you have a fever, pain , or abdominal swelling? No. Pain Score  0 *  Have you tolerated food without any problems? Yes.    Have you been able to return to your normal activities? Yes.    Do you have any questions about your discharge instructions: Diet   No. Medications  No. Follow up visit  No.  Do you have questions or concerns about your Care? No.  Actions: * If pain score is 4 or above: No action needed, pain <4.  1. Have you developed a fever since your procedure? no  2.   Have you had an respiratory symptoms (SOB or cough) since your procedure? no  3.   Have you tested positive for COVID 19 since your procedure no 4.   Have you had any family members/close contacts diagnosed with the COVID 19 since your procedure?  no   If yes to any of these questions please route to Joylene John, RN and Joella Prince, RN

## 2020-06-23 ENCOUNTER — Encounter: Payer: Self-pay | Admitting: Gastroenterology

## 2020-07-25 ENCOUNTER — Ambulatory Visit (INDEPENDENT_AMBULATORY_CARE_PROVIDER_SITE_OTHER): Payer: 59 | Admitting: Gastroenterology

## 2020-07-25 ENCOUNTER — Encounter: Payer: Self-pay | Admitting: Gastroenterology

## 2020-07-25 ENCOUNTER — Other Ambulatory Visit: Payer: Self-pay

## 2020-07-25 VITALS — BP 110/72 | HR 61 | Ht 69.0 in | Wt 135.1 lb

## 2020-07-25 DIAGNOSIS — K921 Melena: Secondary | ICD-10-CM | POA: Diagnosis not present

## 2020-07-25 DIAGNOSIS — R195 Other fecal abnormalities: Secondary | ICD-10-CM | POA: Diagnosis not present

## 2020-07-25 DIAGNOSIS — R140 Abdominal distension (gaseous): Secondary | ICD-10-CM

## 2020-07-25 DIAGNOSIS — R1310 Dysphagia, unspecified: Secondary | ICD-10-CM | POA: Diagnosis not present

## 2020-07-25 DIAGNOSIS — K640 First degree hemorrhoids: Secondary | ICD-10-CM

## 2020-07-25 DIAGNOSIS — R1030 Lower abdominal pain, unspecified: Secondary | ICD-10-CM

## 2020-07-25 NOTE — Patient Instructions (Signed)
If you are age 33 or older, your body mass index should be between 23-30. Your Body mass index is 19.95 kg/m. If this is out of the aforementioned range listed, please consider follow up with your Primary Care Provider.  If you are age 78 or younger, your body mass index should be between 19-25. Your Body mass index is 19.95 kg/m. If this is out of the aformentioned range listed, please consider follow up with your Primary Care Provider.   Please start a fiber supplement and continue with your probiotic and Bentyl  Low FODMAP Diet: (Fermentable Oligosaccharides, Disaccharides, Monosaccharides, and Polyols) These are short chain carbohydrates and sugar alcohols that are poorly absorbed by the body, resulting in multiple abdominal symptoms, including changes in bowel habits, abdominal pain/discomfort, bloating, abdominal distension, gas, etc.       ________________________________________________________ Benjamin Reed have been scheduled for an esophageal manometry at [facility] on 08/24/2020 at 8:30am. Please arrive 30 minutes prior to your procedure for registration. You will need to go to outpatient registration (1st floor of the hospital) first. Make certain to bring your insurance cards as well as a complete list of medications.  Please remember the following:  1) Do not take any muscle relaxants, xanax (alprazolam) or ativan for 1 day prior to your test as well as the day of the test.  2) Nothing to eat or drink for 4 hours before your test.  3) Hold all diabetic medications/insulin the morning of the test. You may eat and take your medications after the test.  It will take at least 2 weeks to receive the results of this test from your physician. ------------------------------------------ ABOUT ESOPHAGEAL MANOMETRY Esophageal manometry (muh-NOM-uh-tree) is a test that gauges how well your esophagus works. Your esophagus is the long, muscular tube that connects your throat to your  stomach. Esophageal manometry measures the rhythmic muscle contractions (peristalsis) that occur in your esophagus when you swallow. Esophageal manometry also measures the coordination and force exerted by the muscles of your esophagus.  During esophageal manometry, a thin, flexible tube (catheter) that contains sensors is passed through your nose, down your esophagus and into your stomach. Esophageal manometry can be helpful in diagnosing some mostly uncommon disorders that affect your esophagus.  Why it's done Esophageal manometry is used to evaluate the movement (motility) of food through the esophagus and into the stomach. The test measures how well the circular bands of muscle (sphincters) at the top and bottom of your esophagus open and close, as well as the pressure, strength and pattern of the wave of esophageal muscle contractions that moves food along.  What you can expect Esophageal manometry is an outpatient procedure done without sedation. Most people tolerate it well. You may be asked to change into a hospital gown before the test starts.  During esophageal manometry  While you are sitting up, a member of your health care team sprays your throat with a numbing medication or puts numbing gel in your nose or both.  A catheter is guided through your nose into your esophagus. The catheter may be sheathed in a water-filled sleeve. It doesn't interfere with your breathing. However, your eyes may water, and you may gag. You may have a slight nosebleed from irritation.  After the catheter is in place, you may be asked to lie on your back on an exam table, or you may be asked to remain seated.  You then swallow small sips of water. As you do, a computer connected to  the catheter records the pressure, strength and pattern of your esophageal muscle contractions.  During the test, you'll be asked to breathe slowly and smoothly, remain as still as possible, and swallow only when you're asked to do so.   A member of your health care team may move the catheter down into your stomach while the catheter continues its measurements.  The catheter then is slowly withdrawn. _____________________________________________________ The test usually lasts 20 to 30 minutes.  After esophageal manometry  When your esophageal manometry is complete, you may return to your normal activities  This test typically takes 30-45 minutes to complete. ________________________________________________________________________________

## 2020-07-25 NOTE — Progress Notes (Signed)
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  He Reports a history of Eosinophilic Esophagitis on EGD while in the [employer] in 2009.  He was treated with an inhaler for several years without significant improvement.  EGD by Dr. Collene Mares in 2012 unremarkable patient.  Has been taking omeprazole 40 mg/day for the last 4+ years, then increased to BID in 04/2020 without significant improvement.  Repeat EGD 05/2020 largely unremarkable as outlined below.  2) IBS.  Historically diarrhea predominant, but more recently with mixed pattern stools.  Complicated by C. difficile infection in 04/2020, treated with vancomycin and no e/o CDI on colonoscopy in 05/2020.  3) History of anal fissure and hemorrhoids with prior hemorrhoidal surgery in 2013   Endoscopic History: -EGD 2009 while in the [employer] reportedly notable for EOE per patient.  No report available for review - Colonoscopy 2009 while in the [employer] normal per patient.  No report available for review - EGD (01/2010, Dr. Collene Mares): No report available for review.  Previously requested records - Colonoscopy (01/2010, Dr. Collene Mares): Internal hemorrhoids, otherwise normal - EGD (05/2020): Stenosis at 39 cm dilated with 20 mm TTS without mucosal rent then fractured with forceps (path: Normal).  Remainder of esophagus normal (path: Normal).  Small sliding HH, normal stomach (path benign), normal duodenum (path benign) - Colonoscopy (05/2020): 5 mm sigmoid polyp (TA), normal colon mucosa (biopsies negative for MC, IBD), scar in distal rectum from prior hemorrhoid surgery, small grade 1 hemorrhoids.  Repeat in 7 years  HPI:     Patient is a 35 y.o. male presenting to the Gastroenterology Clinic for follow-up.  Was initially seen by Carl Best  05/12/2020 for evaluation of dysphagia, diarrhea, abdominal pain, rectal bleeding.  GI PCR panel with C. difficile toxin positive and treated with vancomycin.  Subsequent EGD/colonoscopy completed 06/15/2020 and largely unremarkable, as outlined above.  Today, states he continues to have dysphagia pointing to his entire throat and sternum.  No food impactions.  Symptoms can occur with water alone.  No change with recent EGD with dilation.  Also with alternating bowel habits.  Can have diarrhea/constipation alternating daily.  Does have intermittent lower abdominal and periumbilical pain.  The lower abdominal pain can last 5-60 minutes in the periumbilical pain last 1-2 minutes.  Both occur seemingly at random and not associated with BMs or p.o. intake.  Does not improve with BM.  Started having BRBPR in the last week or so as well.  Thinks this was in the context of constipation/straining.  Did have internal hemorrhoids on recent colonoscopy and has prior history of hemorrhoid surgery 2013.   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   Nasal congestion 12/09/2012  Pneumonia    Seizure ([place])    as a baby d/t fevers   Stroke ([place])    medication induced from Zoloft    Patient's surgical history, family medical history, social history, medications and allergies were all reviewed in Epic    Current Outpatient Medications  Medication Sig Dispense Refill   acetaminophen (TYLENOL) 500 MG tablet Take 500-1,000 mg by mouth as needed.     cetirizine (ZYRTEC) 10 MG tablet Take 10 mg by mouth daily.      Cholecalciferol (VITAMIN D3 PO) Take 1 tablet by mouth daily.     dicyclomine (BENTYL) 10 MG capsule Take 1 capsule (10 mg total) by mouth as needed for spasms. Take q 6 hours prn. 60 capsule 3   famotidine (PEPCID) 20 MG tablet One after supper 30 tablet 11   omeprazole (PRILOSEC) 40 MG capsule Take 1 capsule (40 mg total) by mouth in the morning and at bedtime. 60 capsule 2   vitamin C (ASCORBIC ACID) 500 MG tablet Take 500 mg by mouth daily.     No current facility-administered medications for this visit.    Physical Exam:     BP 110/72   Pulse 61   Ht 5\' 9"  (1.753 m)   Wt 135 lb 2 oz (61.3 kg)   SpO2 99%   BMI 19.95 kg/m   GENERAL:  Pleasant male in NAD PSYCH: : Cooperative, normal affect EENT:  conjunctiva pink, mucous membranes moist, neck supple without masses CARDIAC:  RRR, no murmur heard, no peripheral edema PULM: Normal respiratory effort, lungs CTA bilaterally, no wheezing ABDOMEN:  Nondistended, soft, nontender. No obvious masses, no hepatomegaly,  normal bowel sounds SKIN:  turgor, no lesions seen Musculoskeletal:  Normal muscle tone, normal strength NEURO: Alert and oriented x 3, no focal neurologic deficits   IMPRESSION and PLAN:    1) Dysphagia - Not sure what to make of his reported history of EOE.  Aside from non-obstructing distal esophageal stricture dilated with 20 mm TTS balloon and fractured with forceps, and pathology otherwise unremarkable.  I suppose it is possible that esophageal eosinophilia is PPI responsive, and could consider repeat EGD off PPI x8 weeks to try to reestablish Dx - Sent for esophageal manometry - Continue cutting food into small pieces, chewing thoroughly, and plenty of fluids with meals - Can continue PPI for now, but very low threshold to stop  2) Change in bowel habits 3) IBS-mixed type 4) Abdominal bloating 5) Lower abdominal pain - Start low FODMAP diet.  Provided with handout and detailed instruction today - Start  fiber supplement such as Citrucel or Benefiber - Start Bentyl 10 mg prn.  This was on his med list, but he has never taken - If no improvement, plan for CT abdomen/pelvis  6) Internal hemorrhoids - If no improvement in hemorrhoidal symptoms with the treatment of the above, plan for referral to Colorectal surgery for hemorrhoid banding in the setting of prior surgery  RTC in 3-6 months or sooner as needed  I spent 35 minutes of time, including in depth chart review, independent review of results as outlined above, communicating results with the patient directly, face-to-face time with the patient, coordinating care, and ordering studies and medications as appropriate, and documentation.           Lavena Bullion ,DO, FACG 07/25/2020, 9:32 AM

## 2020-07-27 ENCOUNTER — Encounter: Payer: Self-pay | Admitting: *Deleted

## 2020-08-17 ENCOUNTER — Encounter: Payer: Self-pay | Admitting: Gastroenterology

## 2020-08-31 ENCOUNTER — Encounter (HOSPITAL_COMMUNITY)
Admission: RE | Disposition: A | Discharge status: Home / Self Care | Payer: Self-pay | Source: Ambulatory Visit | Attending: Gastroenterology

## 2020-08-31 ENCOUNTER — Ambulatory Visit (HOSPITAL_COMMUNITY)
Admission: RE | Admit: 2020-08-31 | Discharge: 2020-08-31 | Disposition: A | Discharge status: Home / Self Care | Payer: 59 | Source: Ambulatory Visit | Attending: Gastroenterology | Admitting: Gastroenterology

## 2020-08-31 DIAGNOSIS — R1319 Other dysphagia: Secondary | ICD-10-CM

## 2020-08-31 DIAGNOSIS — R1310 Dysphagia, unspecified: Secondary | ICD-10-CM | POA: Insufficient documentation

## 2020-08-31 HISTORY — PX: ESOPHAGEAL MANOMETRY: SHX5429

## 2020-08-31 SURGERY — MANOMETRY, ESOPHAGUS

## 2020-08-31 MED ORDER — LIDOCAINE VISCOUS HCL 2 % MT SOLN
OROMUCOSAL | Status: AC
  Filled 2020-08-31: qty 15

## 2020-08-31 SURGICAL SUPPLY — 2 items
FACESHIELD LNG OPTICON STERILE (SAFETY) IMPLANT
GLOVE BIO SURGEON STRL SZ8 (GLOVE) ×4 IMPLANT

## 2020-08-31 NOTE — Progress Notes (Signed)
Esophageal manometry performed per protocol.  Patient tolerated well.  Report to be sent to Dr. Kavitha Nandigam 

## 2020-09-01 ENCOUNTER — Encounter (HOSPITAL_COMMUNITY): Payer: Self-pay | Admitting: Gastroenterology

## 2020-09-26 ENCOUNTER — Telehealth: Payer: Self-pay | Admitting: Gastroenterology

## 2020-09-26 NOTE — Telephone Encounter (Signed)
Dr. Silverio Decamp, have you had a chance to review esophageal manometry results from 08/31/20? Please advise, thanks.

## 2020-09-26 NOTE — Telephone Encounter (Signed)
Pt's mother called inquiring about results of esophageal mano that pt had done on 08/31/20. Pls call pt with results.

## 2021-02-28 ENCOUNTER — Ambulatory Visit: Payer: 59 | Admitting: Registered Nurse

## 2021-02-28 ENCOUNTER — Other Ambulatory Visit: Payer: Self-pay

## 2021-02-28 ENCOUNTER — Encounter: Payer: Self-pay | Admitting: Registered Nurse

## 2021-02-28 VITALS — BP 112/67 | HR 71 | Temp 98.0°F | Resp 17 | Ht 69.5 in | Wt 135.0 lb

## 2021-02-28 DIAGNOSIS — R6884 Jaw pain: Secondary | ICD-10-CM | POA: Diagnosis not present

## 2021-02-28 DIAGNOSIS — K047 Periapical abscess without sinus: Secondary | ICD-10-CM | POA: Diagnosis not present

## 2021-02-28 MED ORDER — TRAMADOL HCL 50 MG PO TABS: 50.0000 mg | ORAL_TABLET | Freq: Three times a day (TID) | ORAL | 0 refills | Status: DC | PRN

## 2021-02-28 MED ORDER — DOXYCYCLINE HYCLATE 100 MG PO TABS: 100.0000 mg | ORAL_TABLET | Freq: Two times a day (BID) | ORAL | 0 refills | Status: DC

## 2021-02-28 NOTE — Progress Notes (Signed)
Established Patient Office Visit  Subjective:  Patient ID: Benjamin Reed, male    DOB: October 17, 1985  Age: 36 y.o. MRN: [id number]  CC:  Chief Complaint  Patient presents with   Jaw Pain    Patient woke up two days ago with jaw pain and now today he has left side jaw swelling.    HPI Benjamin Reed presents for jaw pain  L side Onset 2 days ago - woke him from sleep Pain in mouth, sore throat/pain with swallowing. Noted late yesterday L jaw started swelling and became very tender. Trouble opening mouth all the way Poor dentition, hx of chewing tobacco but has stopped  No new oral lesions.  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   Nasal congestion 12/09/2012   Pneumonia    Seizure ([place])    as a baby d/t fevers   Stroke ([place])    medication induced from Zoloft    Past Surgical History:  Procedure Laterality Date   COLONOSCOPY     ESOPHAGEAL MANOMETRY N/A 08/31/2020   Procedure: ESOPHAGEAL MANOMETRY (EM);  Surgeon: Lavena Bullion, DO;  Location: [facility];  Service: Gastroenterology;  Laterality: N/A;   HEMORRHOID SURGERY  04/12/2010   procedure for prolapse and hemorrhoids   KNEE ARTHROSCOPY Left 2013   KNEE ARTHROSCOPY Right 12/16/2012   Procedure: RIGHT ARTHROSCOPY KNEE;  Surgeon: Hessie Dibble, MD;  Location: [facility];  Service: Orthopedics;  Laterality: Right;   UPPER GASTROINTESTINAL ENDOSCOPY      Family History  Problem Relation Age of Onset   Hyperlipidemia Mother        GPs   Hypertension Mother        GPs   Clotting disorder Mother    Heart disease Mother    Prostate cancer Paternal  Grandfather    Heart disease Paternal Grandfather    Colon polyps Paternal Grandfather    Colon cancer Other        [place] dx in her 83s to 34s   Colon polyps Other        GF dx in his late 35s, aunt   Barrett's esophagus Brother    Cleft palate Brother    Colon polyps Maternal Grandmother    Diabetes Maternal Grandmother    Hypertension Maternal Grandmother    Colon polyps Maternal Grandfather    Clotting disorder Maternal Grandfather    Heart disease Maternal Grandfather    Hypertension Maternal Grandfather    Clotting disorder Maternal Uncle    Hypertension Maternal Uncle    Heart disease Paternal Uncle        x 2   Colon polyps Maternal Aunt    Esophageal cancer Neg Hx    Rectal cancer Neg Hx    Stomach cancer Neg Hx     Social History   Socioeconomic History   Marital status: Divorced    Spouse name: Not on file   Number of children: 2   Years of education: Not on file   Highest education level:  Not on file  Occupational History   Not on file  Tobacco Use   Smoking status: Former    Packs/day: 0.50    Years: 23.00    Pack years: 11.50    Types: Cigarettes    Quit date: 01/30/2019    Years since quitting: 2.0   Smokeless tobacco: Former    Types: Chew    Quit date: 2021  Vaping Use   Vaping Use: Never used  Substance and Sexual Activity   Alcohol use: Not Currently    Comment: weekly prior to c/o x today   Drug use: No   Sexual activity: Not Currently  Other Topics Concern   Not on file  Social History Narrative   Was in the army.    Social Determinants of Health   Financial Resource Strain: Not on file  Food Insecurity: Not on file  Transportation Needs: Not on file  Physical Activity: Not on file  Stress: Not on file  Social Connections: Not on file  Intimate Partner Violence: Not on file    Outpatient Medications Prior to Visit  Medication Sig Dispense Refill   acetaminophen (TYLENOL) 500 MG tablet Take 500-1,000 mg by mouth as needed.      cetirizine (ZYRTEC) 10 MG tablet Take 10 mg by mouth daily.     Cholecalciferol (VITAMIN D3 PO) Take 1 tablet by mouth daily.     dicyclomine (BENTYL) 10 MG capsule Take 1 capsule (10 mg total) by mouth as needed for spasms. Take q 6 hours prn. 60 capsule 3   famotidine (PEPCID) 20 MG tablet One after supper 30 tablet 11   vitamin C (ASCORBIC ACID) 500 MG tablet Take 500 mg by mouth daily.     omeprazole (PRILOSEC) 40 MG capsule Take 1 capsule (40 mg total) by mouth in the morning and at bedtime. 60 capsule 2   No facility-administered medications prior to visit.    Allergies  Allergen Reactions   Zoloft [Sertraline Hcl] Other (See Comments)    CHEST PAIN, SYNCOPE   Cephalexin Hives   Other Other (See Comments)    MREs - ESOPHAGEAL EROSION   Nsaids     ROS Review of Systems  Constitutional: Negative.   Eyes: Negative.   Respiratory: Negative.    Cardiovascular: Negative.   Gastrointestinal: Negative.   Genitourinary: Negative.   Musculoskeletal: Negative.   Skin: Negative.   Neurological: Negative.   Psychiatric/Behavioral: Negative.    All other systems reviewed and are negative.    Objective:    Physical Exam Constitutional:      General: He is not in acute distress.    Appearance: Normal appearance. He is normal weight. He is not ill-appearing, toxic-appearing or diaphoretic.  HENT:     Mouth/Throat:     Lips: No lesions.     Mouth: Mucous membranes are moist. No oral lesions.     Dentition: Abnormal dentition. Dental caries present.     Tongue: No lesions. Tongue does not deviate from midline.     Pharynx: Oropharynx is clear. Uvula midline.     Tonsils: No tonsillar exudate or tonsillar abscesses.  Neck:     Comments: R submandibular adenopathy. Painful to touch. Does not appear as tonsillar abscess. Does have tenderness in cervical chains Cardiovascular:     Rate and Rhythm: Normal rate and regular rhythm.     Heart sounds: Normal heart sounds. No murmur  heard.   No friction rub. No gallop.  Pulmonary:  Effort: Pulmonary effort is normal. No respiratory distress.     Breath sounds: Normal breath sounds. No stridor. No wheezing, rhonchi or rales.  Chest:     Chest wall: No tenderness.  Musculoskeletal:     Cervical back: Full passive range of motion without pain and normal range of motion.  Lymphadenopathy:     Cervical: Cervical adenopathy present.  Neurological:     General: No focal deficit present.     Mental Status: He is alert and oriented to person, place, and time. Mental status is at baseline.  Psychiatric:        Mood and Affect: Mood normal.        Behavior: Behavior normal.        Thought Content: Thought content normal.        Judgment: Judgment normal.    BP 112/67    Pulse 71    Temp 98 F (36.7 C) (Temporal)    Resp 17    Ht 5' 9.5" (1.765 m)    Wt 135 lb (61.2 kg)    SpO2 100%    BMI 19.65 kg/m  Wt Readings from Last 3 Encounters:  02/28/21 135 lb (61.2 kg)  08/24/20 135 lb (61.2 kg)  07/25/20 135 lb 2 oz (61.3 kg)     Health Maintenance Due  Topic Date Due   COVID-19 Vaccine (1) Never done   HIV Screening  Never done   Hepatitis C Screening  Never done   TETANUS/TDAP  Never done   INFLUENZA VACCINE  Never done    There are no preventive care reminders to display for this patient.  Lab Results  Component Value Date   TSH 1.47 03/23/2020   Lab Results  Component Value Date   WBC 5.6 05/05/2020   HGB 15.4 05/05/2020   HCT 46.2 05/05/2020   MCV 90.2 05/05/2020   PLT 228 05/05/2020   Lab Results  Component Value Date   NA 139 05/05/2020   K 3.9 05/05/2020   CO2 28 05/05/2020   GLUCOSE 102 (H) 05/05/2020   BUN 13 05/05/2020   CREATININE 0.86 05/05/2020   BILITOT 0.9 03/25/2020   ALKPHOS 45 03/25/2020   AST 19 03/25/2020   ALT 17 03/25/2020   PROT 7.6 03/25/2020   ALBUMIN 5.0 03/25/2020   CALCIUM 9.6 05/05/2020   ANIONGAP 9 05/05/2020   GFR 109.95 03/23/2020   Lab Results   Component Value Date   CHOL 147 02/01/2010   No results found for: HDL No results found for: LDLCALC No results found for: TRIG No results found for: CHOLHDL No results found for: HGBA1C    Assessment & Plan:   Problem List Items Addressed This Visit   None Visit Diagnoses     Jaw pain    -  Primary   Relevant Medications   doxycycline (VIBRA-TABS) 100 MG tablet   traMADol (ULTRAM) 50 MG tablet   Dental infection       Relevant Medications   doxycycline (VIBRA-TABS) 100 MG tablet   traMADol (ULTRAM) 50 MG tablet       Meds ordered this encounter  Medications   doxycycline (VIBRA-TABS) 100 MG tablet    Sig: Take 1 tablet (100 mg total) by mouth 2 (two) times daily.    Dispense:  20 tablet    Refill:  0    Order Specific Question:   Supervising Provider    Answer:   Carlota Raspberry, JEFFREY R [[id number]]   traMADol (ULTRAM) 50 MG tablet  Sig: Take 1 tablet (50 mg total) by mouth every 8 (eight) hours as needed.    Dispense:  10 tablet    Refill:  0    Order Specific Question:   Supervising Provider    Answer:   Carlota Raspberry, JEFFREY R [[id number]]    Follow-up: Return if symptoms worsen or fail to improve.   PLAN Allergy to keflex in past. Will send doxycycline.  Tramadol for pain tid prn ER precautions reviewed - worsening symptoms, constitutional symptoms, dysphagia/choking worsening. Recommend follow up with dentistry Patient encouraged to call clinic with any questions, comments, or concerns.  Maximiano Coss, NP

## 2021-02-28 NOTE — Patient Instructions (Addendum)
Mr. Rands -  Doristine Devoid to meet you  Call with worsening symptoms or if not improving within 48 hours  Finish entire course of doxycycline even if you are feeling better.  Can use tramadol and tylenol as directed as needed  Thank you,  [address], [address], [place], [place] [address] [phone] [email or web address]

## 2021-03-07 ENCOUNTER — Encounter: Payer: Self-pay | Admitting: Registered Nurse

## 2021-03-07 ENCOUNTER — Ambulatory Visit (INDEPENDENT_AMBULATORY_CARE_PROVIDER_SITE_OTHER): Payer: 59 | Admitting: Registered Nurse

## 2021-03-07 VITALS — BP 116/78 | HR 83 | Temp 98.1°F | Resp 15 | Ht 69.5 in | Wt 133.4 lb

## 2021-03-07 DIAGNOSIS — R6884 Jaw pain: Secondary | ICD-10-CM | POA: Diagnosis not present

## 2021-03-07 MED ORDER — PREDNISONE 10 MG PO TABS: ORAL_TABLET | ORAL | 0 refills | Status: AC

## 2021-03-07 NOTE — Patient Instructions (Signed)
Mr. Tyler -   Doristine Devoid to see you, sorry it's so soon  Prednisone taper as prescribed should help. If not, let me know  Have referred to ENT to see if we can get an expert opinion on this  CT maxillofacial to rule out mass or abscess  Will keep you in the loop on results  Thanks,  Rich
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
## 2021-03-13 ENCOUNTER — Other Ambulatory Visit: Payer: Self-pay | Admitting: Registered Nurse

## 2021-03-13 DIAGNOSIS — R6884 Jaw pain: Secondary | ICD-10-CM

## 2021-03-16 ENCOUNTER — Ambulatory Visit (INDEPENDENT_AMBULATORY_CARE_PROVIDER_SITE_OTHER): Payer: 59 | Admitting: Family Medicine

## 2021-03-16 ENCOUNTER — Encounter: Payer: Self-pay | Admitting: Family Medicine

## 2021-03-16 VITALS — BP 122/72 | HR 63 | Temp 98.2°F | Resp 16 | Ht 70.0 in | Wt 135.2 lb

## 2021-03-16 DIAGNOSIS — Z23 Encounter for immunization: Secondary | ICD-10-CM | POA: Diagnosis not present

## 2021-03-16 DIAGNOSIS — Z1159 Encounter for screening for other viral diseases: Secondary | ICD-10-CM

## 2021-03-16 DIAGNOSIS — Z13220 Encounter for screening for lipoid disorders: Secondary | ICD-10-CM

## 2021-03-16 DIAGNOSIS — R6884 Jaw pain: Secondary | ICD-10-CM

## 2021-03-16 DIAGNOSIS — Z0001 Encounter for general adult medical examination with abnormal findings: Secondary | ICD-10-CM | POA: Diagnosis not present

## 2021-03-16 DIAGNOSIS — Z114 Encounter for screening for human immunodeficiency virus [HIV]: Secondary | ICD-10-CM

## 2021-03-16 DIAGNOSIS — R3989 Other symptoms and signs involving the genitourinary system: Secondary | ICD-10-CM

## 2021-03-16 DIAGNOSIS — R1032 Left lower quadrant pain: Secondary | ICD-10-CM

## 2021-03-16 DIAGNOSIS — R7309 Other abnormal glucose: Secondary | ICD-10-CM

## 2021-03-16 DIAGNOSIS — R300 Dysuria: Secondary | ICD-10-CM

## 2021-03-16 DIAGNOSIS — R1031 Right lower quadrant pain: Secondary | ICD-10-CM

## 2021-03-16 DIAGNOSIS — Z0000 Encounter for general adult medical examination without abnormal findings: Secondary | ICD-10-CM | POA: Insufficient documentation

## 2021-03-16 LAB — CBC WITH DIFFERENTIAL/PLATELET
Basophils Absolute: 0.1 10*3/uL (ref 0.0–0.1)
Basophils Relative: 0.5 % (ref 0.0–3.0)
Eosinophils Absolute: 0 10*3/uL (ref 0.0–0.7)
Eosinophils Relative: 0.1 % (ref 0.0–5.0)
HCT: 41.2 % (ref 39.0–52.0)
Hemoglobin: 13.8 g/dL (ref 13.0–17.0)
Lymphocytes Relative: 12 % (ref 12.0–46.0)
Lymphs Abs: 1.2 10*3/uL (ref 0.7–4.0)
MCHC: 33.4 g/dL (ref 30.0–36.0)
MCV: 90.1 fl (ref 78.0–100.0)
Monocytes Absolute: 0.4 10*3/uL (ref 0.1–1.0)
Monocytes Relative: 3.6 % (ref 3.0–12.0)
Neutro Abs: 8.5 10*3/uL — ABNORMAL HIGH (ref 1.4–7.7)
Neutrophils Relative %: 83.8 % — ABNORMAL HIGH (ref 43.0–77.0)
Platelets: 263 10*3/uL (ref 150.0–400.0)
RBC: 4.57 Mil/uL (ref 4.22–5.81)
RDW: 13.7 % (ref 11.5–15.5)
WBC: 10.2 10*3/uL (ref 4.0–10.5)

## 2021-03-16 LAB — POCT URINALYSIS DIPSTICK
Bilirubin, UA: NEGATIVE
Blood, UA: NEGATIVE
Glucose, UA: NEGATIVE
Ketones, UA: NEGATIVE
Leukocytes, UA: NEGATIVE
Nitrite, UA: NEGATIVE
Protein, UA: NEGATIVE
Spec Grav, UA: 1.01 (ref 1.010–1.025)
Urobilinogen, UA: 0.2 E.U./dL
pH, UA: 7.5 (ref 5.0–8.0)

## 2021-03-16 LAB — LIPID PANEL
Cholesterol: 191 mg/dL (ref 0–200)
HDL: 59.9 mg/dL (ref >39.00)
LDL Cholesterol: 115 mg/dL — ABNORMAL HIGH (ref 0–99)
NonHDL: 131.56
Total CHOL/HDL Ratio: 3
Triglycerides: 83 mg/dL (ref 0.0–149.0)
VLDL: 16.6 mg/dL (ref 0.0–40.0)

## 2021-03-16 LAB — BASIC METABOLIC PANEL
BUN: 10 mg/dL (ref 6–23)
CO2: 36 mEq/L — ABNORMAL HIGH (ref 19–32)
Calcium: 9.6 mg/dL (ref 8.4–10.5)
Chloride: 101 mEq/L (ref 96–112)
Creatinine, Ser: 0.81 mg/dL (ref 0.40–1.50)
GFR: 115.74 mL/min (ref >60.00)
Glucose, Bld: 108 mg/dL — ABNORMAL HIGH (ref 70–99)
Potassium: 4.2 mEq/L (ref 3.5–5.1)
Sodium: 141 mEq/L (ref 135–145)

## 2021-03-16 LAB — HEPATIC FUNCTION PANEL
ALT: 25 U/L (ref 0–53)
AST: 18 U/L (ref 0–37)
Albumin: 4.7 g/dL (ref 3.5–5.2)
Alkaline Phosphatase: 43 U/L (ref 39–117)
Bilirubin, Direct: 0.1 mg/dL (ref 0.0–0.3)
Total Bilirubin: 0.6 mg/dL (ref 0.2–1.2)
Total Protein: 6.8 g/dL (ref 6.0–8.3)

## 2021-03-16 LAB — TSH: TSH: 0.8 u[IU]/mL (ref 0.35–5.50)

## 2021-03-16 MED ORDER — TRAMADOL HCL 50 MG PO TABS: 50.0000 mg | ORAL_TABLET | Freq: Three times a day (TID) | ORAL | 0 refills | Status: DC | PRN

## 2021-03-16 NOTE — Assessment & Plan Note (Signed)
Pt's PE WNL today despite having numerous ongoing concerns and positive ROS.  He has multiple specialists who are evaluating and treating these issues.  I encouraged him to follow up w/ them if sxs are ongoing.  Tdap given.  Will check labs.  Anticipatory guidance provided.

## 2021-03-16 NOTE — Patient Instructions (Addendum)
Follow up in 1 year or as needed We'll notify you of your lab results and make any changes if needed We'll call you with your Urology appt Make sure you follow up with GI if things aren't improving Call with any questions or concerns Hang in there!!

## 2021-03-16 NOTE — Progress Notes (Signed)
° °  Subjective:    Patient ID: Benjamin Reed, male    DOB: [birth year], 36 y.o.   MRN: [id number]  HPI CPE- due for Tdap.  Health Maintenance  Topic Date Due   HIV Screening  Never done   Hepatitis C Screening  Never done   TETANUS/TDAP  Never done   COVID-19 Vaccine (3 - Booster for Moderna series) 02/26/2020   INFLUENZA VACCINE  Never done   COLONOSCOPY (Pts 45-13yrs Insurance coverage will need to be confirmed)  06/16/2027   HPV VACCINES  Aged Out      Review of Systems Patient reports no vision/hearing changes, anorexia, fever ,adenopathy, persistant/recurrent hoarseness, chest pain, palpitations, edema, hemoptysis, gastrointestinal  bleeding (melena, rectal bleeding), syncope, focal weakness, memory loss, numbness & tingling, skin/hair/nail changes, depression, anxiety, abnormal bruising, musculoskeletal symptoms/signs.   Bilateral groin pain, has sharp pain in his bladder w/ urination.  Reports he is unable to 'hit the toilet' w/o 'spraying everywhere'. + ringing in ears- L>R + chronic cough + dysphagia- seeing GI + SOB + abd pain + GERD + bowel changes- IBSD to mixed to IBSC + hemorrhoid bleeding  This visit occurred during the SARS-CoV-2 public health emergency.  Safety protocols were in place, including screening questions prior to the visit, additional usage of staff PPE, and extensive cleaning of exam room while observing appropriate contact time as indicated for disinfecting solutions.      Objective:   Physical Exam General Appearance:    Alert, cooperative, no distress, appears stated age  Head:    Normocephalic, without obvious abnormality, atraumatic  Eyes:    PERRL, conjunctiva/corneas clear, EOM's intact, fundi    benign, both eyes       Ears:    Normal TM's and external ear canals, both ears  Nose:   Deferred due to COVID  Throat:   Neck:   Supple, symmetrical, trachea midline, no adenopathy;       thyroid:  No enlargement/tenderness/nodules  Back:      Symmetric, no curvature, ROM normal, no CVA tenderness  Lungs:     Clear to auscultation bilaterally, respirations unlabored  Chest wall:    No tenderness or deformity  Heart:    Regular rate and rhythm, S1 and S2 normal, no murmur, rub   or gallop  Abdomen:     Soft, non-tender, bowel sounds active all four quadrants,    no masses, no organomegaly  Genitalia:    Deferred to urology  Rectal:    Extremities:   Extremities normal, atraumatic, no cyanosis or edema  Pulses:   2+ and symmetric all extremities  Skin:   Skin color, texture, turgor normal, no rashes or lesions  Lymph nodes:   Cervical, supraclavicular, and axillary nodes normal  Neurologic:   CNII-XII intact. Normal strength, sensation and reflexes      throughout          Assessment & Plan:  Bladder pain/dysuria/groin pain- new to provider, ongoing for pt.  He states he has pain sharp pain w/ urination, burning w/ urination, and his stream is no longer a stream but more of a spray.  He states he will also have bilateral groin pain.  UA WNL today.  Will refer to Urology for complete evaluation and tx.  Pt expressed understanding and is in agreement w/ plan.

## 2021-03-17 LAB — HEPATITIS C ANTIBODY
Hepatitis C Ab: NONREACTIVE
SIGNAL TO CUT-OFF: <0.02 (ref <1.00)

## 2021-03-17 LAB — HIV ANTIBODY (ROUTINE TESTING W REFLEX): HIV 1&2 Ab, 4th Generation: NONREACTIVE

## 2021-03-20 ENCOUNTER — Ambulatory Visit (HOSPITAL_COMMUNITY)
Admission: RE | Admit: 2021-03-20 | Discharge: 2021-03-20 | Disposition: A | Discharge status: Home / Self Care | Payer: 59 | Source: Ambulatory Visit | Attending: Registered Nurse | Admitting: Registered Nurse

## 2021-03-20 ENCOUNTER — Other Ambulatory Visit: Payer: Self-pay

## 2021-03-20 DIAGNOSIS — R6884 Jaw pain: Secondary | ICD-10-CM | POA: Diagnosis not present

## 2021-03-20 MED ORDER — IOHEXOL 300 MG/ML  SOLN
100.0000 mL | Freq: Once | INTRAMUSCULAR | Status: AC | PRN
  Administered 2021-03-20: 75 mL via INTRAVENOUS

## 2021-03-21 ENCOUNTER — Other Ambulatory Visit: Payer: Self-pay | Admitting: Registered Nurse

## 2021-03-21 ENCOUNTER — Encounter: Payer: Self-pay | Admitting: Registered Nurse

## 2021-03-21 DIAGNOSIS — R6884 Jaw pain: Secondary | ICD-10-CM

## 2021-03-21 NOTE — Telephone Encounter (Signed)
Pt is looking for further explanation to what is going on after CT has come back normal

## 2021-03-22 ENCOUNTER — Encounter: Payer: Self-pay | Admitting: *Deleted

## 2021-03-24 NOTE — Telephone Encounter (Signed)
Pt left jaw is swollen once again. He would  like to know what can he do to bring the swelling down?

## 2021-03-30 ENCOUNTER — Other Ambulatory Visit: Payer: Self-pay

## 2021-03-30 ENCOUNTER — Encounter: Payer: Self-pay | Admitting: Urology

## 2021-03-30 ENCOUNTER — Ambulatory Visit: Payer: 59 | Admitting: Urology

## 2021-03-30 ENCOUNTER — Other Ambulatory Visit: Payer: Self-pay | Admitting: Registered Nurse

## 2021-03-30 VITALS — BP 144/78 | HR 82 | Ht 69.5 in | Wt 135.0 lb

## 2021-03-30 DIAGNOSIS — R1030 Lower abdominal pain, unspecified: Secondary | ICD-10-CM

## 2021-03-30 DIAGNOSIS — R350 Frequency of micturition: Secondary | ICD-10-CM

## 2021-03-30 DIAGNOSIS — N411 Chronic prostatitis: Secondary | ICD-10-CM

## 2021-03-30 DIAGNOSIS — R590 Localized enlarged lymph nodes: Secondary | ICD-10-CM

## 2021-03-30 LAB — URINALYSIS, ROUTINE W REFLEX MICROSCOPIC
Bilirubin, UA: NEGATIVE
Glucose, UA: NEGATIVE
Ketones, UA: NEGATIVE
Leukocytes,UA: NEGATIVE
Nitrite, UA: NEGATIVE
Protein,UA: NEGATIVE
RBC, UA: NEGATIVE
Specific Gravity, UA: 1.02 (ref 1.005–1.030)
Urobilinogen, Ur: 0.2 mg/dL (ref 0.2–1.0)
pH, UA: 7 (ref 5.0–7.5)

## 2021-03-30 LAB — BLADDER SCAN AMB NON-IMAGING: Scan Result: 16

## 2021-03-30 MED ORDER — SULFAMETHOXAZOLE-TRIMETHOPRIM 800-160 MG PO TABS: 1.0000 | ORAL_TABLET | Freq: Two times a day (BID) | ORAL | 0 refills | Status: AC

## 2021-03-30 MED ORDER — GEMTESA 75 MG PO TABS: 75.0000 mg | ORAL_TABLET | Freq: Every day | ORAL | 0 refills | Status: DC
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
## 2021-04-27 ENCOUNTER — Ambulatory Visit: Payer: 59 | Admitting: Urology

## 2021-04-27 ENCOUNTER — Encounter: Payer: Self-pay | Admitting: Urology

## 2021-04-27 VITALS — BP 123/71 | HR 64 | Ht 69.5 in | Wt 140.0 lb

## 2021-04-27 DIAGNOSIS — N411 Chronic prostatitis: Secondary | ICD-10-CM | POA: Diagnosis not present

## 2021-04-27 DIAGNOSIS — R1030 Lower abdominal pain, unspecified: Secondary | ICD-10-CM

## 2021-04-27 DIAGNOSIS — R350 Frequency of micturition: Secondary | ICD-10-CM | POA: Diagnosis not present

## 2021-04-27 LAB — URINALYSIS, ROUTINE W REFLEX MICROSCOPIC
Bilirubin, UA: NEGATIVE
Glucose, UA: NEGATIVE
Ketones, UA: NEGATIVE
Leukocytes,UA: NEGATIVE
Nitrite, UA: NEGATIVE
Protein,UA: NEGATIVE
RBC, UA: NEGATIVE
Specific Gravity, UA: 1.02 (ref 1.005–1.030)
Urobilinogen, Ur: 0.2 mg/dL (ref 0.2–1.0)
pH, UA: 7 (ref 5.0–7.5)

## 2021-04-27 MED ORDER — CIPROFLOXACIN HCL 500 MG PO TABS
500.0000 mg | ORAL_TABLET | Freq: Once | ORAL | Status: AC
  Administered 2021-04-27: 500 mg via ORAL

## 2021-04-27 MED ORDER — ALFUZOSIN HCL ER 10 MG PO TB24: 10.0000 mg | ORAL_TABLET | Freq: Every day | ORAL | 11 refills | Status: DC
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
## 2021-05-26 ENCOUNTER — Ambulatory Visit: Payer: 59 | Admitting: Urology

## 2021-05-26 ENCOUNTER — Encounter: Payer: Self-pay | Admitting: Urology

## 2021-05-26 VITALS — BP 144/91 | HR 97

## 2021-05-26 DIAGNOSIS — N411 Chronic prostatitis: Secondary | ICD-10-CM

## 2021-05-26 DIAGNOSIS — G8929 Other chronic pain: Secondary | ICD-10-CM

## 2021-05-26 DIAGNOSIS — R350 Frequency of micturition: Secondary | ICD-10-CM | POA: Diagnosis not present

## 2021-05-26 DIAGNOSIS — R102 Pelvic and perineal pain: Secondary | ICD-10-CM

## 2021-05-26 LAB — URINALYSIS, ROUTINE W REFLEX MICROSCOPIC
Bilirubin, UA: NEGATIVE
Glucose, UA: NEGATIVE
Ketones, UA: NEGATIVE
Leukocytes,UA: NEGATIVE
Nitrite, UA: NEGATIVE
Protein,UA: NEGATIVE
RBC, UA: NEGATIVE
Specific Gravity, UA: 1.02 (ref 1.005–1.030)
Urobilinogen, Ur: 0.2 mg/dL (ref 0.2–1.0)
pH, UA: 6.5 (ref 5.0–7.5)

## 2021-05-26 LAB — BLADDER SCAN AMB NON-IMAGING: Scan Result: 43
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
## 2021-07-27 ENCOUNTER — Ambulatory Visit: Payer: 59 | Admitting: Urology

## 2021-10-16 ENCOUNTER — Encounter: Payer: Self-pay | Admitting: Family Medicine

## 2021-10-16 ENCOUNTER — Ambulatory Visit (INDEPENDENT_AMBULATORY_CARE_PROVIDER_SITE_OTHER): Payer: 59 | Admitting: Family Medicine

## 2021-10-16 VITALS — BP 110/62 | HR 56 | Temp 98.2°F | Resp 16 | Ht 69.5 in | Wt 137.0 lb

## 2021-10-16 DIAGNOSIS — H6981 Other specified disorders of Eustachian tube, right ear: Secondary | ICD-10-CM | POA: Diagnosis not present

## 2021-10-16 DIAGNOSIS — H9221 Otorrhagia, right ear: Secondary | ICD-10-CM

## 2021-10-16 MED ORDER — CETIRIZINE HCL 10 MG PO TABS: 10.0000 mg | ORAL_TABLET | Freq: Every day | ORAL | 1 refills | Status: AC

## 2021-10-16 MED ORDER — FLUTICASONE PROPIONATE 50 MCG/ACT NA SUSP: 2.0000 | Freq: Every day | NASAL | 6 refills | Status: DC

## 2021-10-16 NOTE — Progress Notes (Signed)
   Subjective:    Patient ID: Benjamin Reed, male    DOB: [birth year], 36 y.o.   MRN: [id number]  HPI Ear pain- sxs started ~1 week ago w/ R ear fullness.  Pt states when he attempted to clean his ears 'it was black and bloody'.  No bleeding since last week.  Describes intermittent stabbing pains.  No relief w/ ear drops.  Pt reports hearing is now decreased on R side.  No pain w/ pulling on pinna.  No issues w/ L ear.     Review of Systems For ROS see HPI     Objective:   Physical Exam Vitals reviewed.  Constitutional:      General: He is not in acute distress.    Appearance: Normal appearance. He is not ill-appearing.  HENT:     Head: Normocephalic and atraumatic.     Right Ear: No drainage (small clot/early scab along inferior EAC), swelling or tenderness. A middle ear effusion is present. Tympanic membrane is retracted. Tympanic membrane is not injected, perforated or erythematous.     Left Ear: Tympanic membrane, ear canal and external ear normal.     Nose: No rhinorrhea.     Comments: No TTP over frontal or maxillary sinuses    Mouth/Throat:     Mouth: Mucous membranes are moist.     Pharynx: No oropharyngeal exudate.  Musculoskeletal:     Cervical back: Neck supple.  Lymphadenopathy:     Cervical: No cervical adenopathy.  Skin:    General: Skin is warm and dry.  Neurological:     General: No focal deficit present.     Mental Status: He is alert and oriented to person, place, and time.  Psychiatric:        Mood and Affect: Mood normal.        Behavior: Behavior normal.           Assessment & Plan:  Eustachian tube dysfxn- new.  Explained dx to pt and mother.  Will start daily antihistamine and nasal steroid.  Pt expressed understanding and is in agreement w/ plan.   Blood in ear canal- new.  Appears to be small clot/early scab along inferior EAC.  No obvious foreign body or cause of bleeding.  Will let canal heal and reassess at future visits to ensure no mass  is present.

## 2021-10-16 NOTE — Patient Instructions (Signed)
Follow up as needed or as scheduled START the Flonase- 2 sprays each nostril to help open up your ear Drink LOTS of fluids ADD daily Zyrtec (Cetirizine) until after the first frost Try and avoid qtips or drops in the ear for now- let the scab heal completely Call with any questions or concerns Hang in there!!!

## 2021-12-24 ENCOUNTER — Other Ambulatory Visit: Payer: Self-pay

## 2021-12-24 ENCOUNTER — Encounter (HOSPITAL_COMMUNITY): Payer: Self-pay | Admitting: *Deleted

## 2021-12-24 ENCOUNTER — Emergency Department (HOSPITAL_COMMUNITY)
Admission: EM | Admit: 2021-12-24 | Discharge: 2021-12-24 | Disposition: A | Discharge status: Home / Self Care | Payer: 59 | Attending: Emergency Medicine | Admitting: Emergency Medicine

## 2021-12-24 ENCOUNTER — Emergency Department (HOSPITAL_COMMUNITY): Payer: 59

## 2021-12-24 DIAGNOSIS — K208 Other esophagitis without bleeding: Secondary | ICD-10-CM | POA: Insufficient documentation

## 2021-12-24 DIAGNOSIS — K209 Esophagitis, unspecified without bleeding: Secondary | ICD-10-CM | POA: Diagnosis not present

## 2021-12-24 DIAGNOSIS — T50905A Adverse effect of unspecified drugs, medicaments and biological substances, initial encounter: Secondary | ICD-10-CM

## 2021-12-24 DIAGNOSIS — T189XXA Foreign body of alimentary tract, part unspecified, initial encounter: Secondary | ICD-10-CM | POA: Diagnosis not present

## 2021-12-24 MED ORDER — ALUM & MAG HYDROXIDE-SIMETH 200-200-20 MG/5ML PO SUSP
30.0000 mL | Freq: Once | ORAL | Status: AC
Start: 2021-12-24 — End: 2021-12-24
  Administered 2021-12-24: 30 mL via ORAL
  Filled 2021-12-24: qty 30

## 2021-12-24 MED ORDER — MAGIC MOUTHWASH W/LIDOCAINE: 10.0000 mL | Freq: Four times a day (QID) | ORAL | 0 refills | Status: DC | PRN

## 2021-12-24 NOTE — ED Provider Notes (Signed)
[facility] EMERGENCY DEPARTMENT Provider Note   CSN: [id number] Arrival date & time: 12/24/21  8144     History  Chief Complaint  Patient presents with   Foreign Body    Benjamin Reed is a 36 y.o. male presenting for evaluation of throat discomfort, feeling sensation of stuck medication tablets in his throat.  He took several medications this morning including 2 Protonix, 1 Zyrtec tablet and 2 vitamin E supplements and he had a distinct sensation of these pills or stopping in his lower esophagus (patient pointing to his lower neck).  He has had several episodes of coughing, he denies choking, wheezing, stridor.  He has tried sipping water at home which was not helpful.  He reports a significant history of acid reflux and has a large hiatal hernia.  He is under the care of GI specialist Dr. Bryan Lemma in [facility].  The history is provided by the patient.       Home Medications Prior to Admission medications   Medication Sig Start Date End Date Taking? Authorizing Provider  magic mouthwash w/lidocaine SOLN Take 10 mLs by mouth 4 (four) times daily as needed (throat pain). 12/24/21  Yes Hisako Bugh, Almyra Free, PA-C  acetaminophen (TYLENOL) 500 MG tablet Take 500-1,000 mg by mouth as needed.    [provider]  alfuzosin (UROXATRAL) 10 MG 24 hr tablet Take 1 tablet (10 mg total) by mouth daily with breakfast. Patient not taking: Reported on 10/16/2021 04/27/21   Stoneking, Reece Leader., MD  cetirizine (ZYRTEC) 10 MG tablet Take 1 tablet (10 mg total) by mouth daily. 10/16/21   Midge Minium, MD  Cholecalciferol (VITAMIN D3 PO) Take 1 tablet by mouth daily.    [provider]  esomeprazole (NEXIUM) 40 MG capsule Take 40 mg by mouth daily at 12 noon.    [provider]  famotidine (PEPCID) 20 MG tablet One after supper 05/05/20   Tanda Rockers, MD  fluticasone [facility]) 50 MCG/ACT nasal spray Place 2 sprays into both nostrils daily. 10/16/21   Midge Minium,  MD  vitamin C (ASCORBIC ACID) 500 MG tablet Take 500 mg by mouth daily.    [provider]      Allergies    Zoloft [sertraline hcl], Cephalexin, Other, and Nsaids    Review of Systems   Review of Systems  Constitutional:  Negative for chills and fever.  HENT:  Positive for trouble swallowing. Negative for congestion, sore throat and voice change.   Eyes: Negative.   Respiratory:  Negative for chest tightness, shortness of breath, wheezing and stridor.   Cardiovascular:  Negative for chest pain.  Gastrointestinal:  Negative for abdominal pain, nausea and vomiting.  Genitourinary: Negative.   Musculoskeletal:  Negative for arthralgias, joint swelling and neck pain.  Skin: Negative.  Negative for rash and wound.  Neurological:  Negative for dizziness, weakness, light-headedness, numbness and headaches.  Psychiatric/Behavioral: Negative.    All other systems reviewed and are negative.   Physical Exam Updated Vital Signs BP (!) 126/95 (BP Location: Left Arm)   Pulse 93   Temp 97.7 F (36.5 C) (Oral)   Resp 20   Ht 5' 9.5" (1.765 m)   Wt 63.5 kg   SpO2 97%   BMI 20.38 kg/m  Physical Exam Vitals and nursing note reviewed.  Constitutional:      General: He is not in acute distress.    Appearance: He is well-developed.  HENT:     Head: Normocephalic and atraumatic.  Mouth/Throat:     Mouth: Mucous membranes are moist.     Pharynx: Oropharynx is clear. No posterior oropharyngeal erythema.  Eyes:     Conjunctiva/sclera: Conjunctivae normal.  Cardiovascular:     Rate and Rhythm: Normal rate and regular rhythm.     Heart sounds: Normal heart sounds.  Pulmonary:     Effort: Pulmonary effort is normal.     Breath sounds: Normal breath sounds. No wheezing.     Comments: No stridor Abdominal:     General: Bowel sounds are normal.     Palpations: Abdomen is soft.     Tenderness: There is no abdominal tenderness.  Musculoskeletal:        General: Normal range of  motion.     Cervical back: Normal range of motion and neck supple.  Skin:    General: Skin is warm and dry.  Neurological:     Mental Status: He is alert.     ED Results / Procedures / Treatments   Labs (all labs ordered are listed, but only abnormal results are displayed) Labs Reviewed - No data to display  EKG None  Radiology CT Chest Wo Contrast  Result Date: 12/24/2021 CLINICAL DATA:  Swallowed foreign body. EXAM: CT CHEST WITHOUT CONTRAST TECHNIQUE: Multidetector CT imaging of the chest was performed following the standard protocol without IV contrast. RADIATION DOSE REDUCTION: This exam was performed according to the departmental dose-optimization program which includes automated exposure control, adjustment of the mA and/or kV according to patient size and/or use of iterative reconstruction technique. COMPARISON:  March 25, 2020. FINDINGS: Cardiovascular: No significant vascular findings. Normal heart size. No pericardial effusion. Mediastinum/Nodes: No enlarged mediastinal or axillary lymph nodes. Thyroid gland, trachea, and esophagus demonstrate no significant findings. No radiopaque foreign body is noted in the esophagus or visualized stomach. Stable mild thymic hyperplasia. Lungs/Pleura: No pneumothorax or pleural effusion is noted. Left lung is clear. Stable small nodules are noted in medial portion of right posterior costophrenic sulcus, the largest measuring 4 mm. These can be considered benign at this point with no further follow-up required. Upper Abdomen: No acute abnormality. Musculoskeletal: No chest wall mass or suspicious bone lesions identified. IMPRESSION: No significant abnormality seen in the chest. Electronically Signed   By: Marijo Conception M.D.   On: 12/24/2021 10:53    Procedures Procedures    Medications Ordered in ED Medications  alum & mag hydroxide-simeth (MAALOX/MYLANTA) 200-200-20 MG/5ML suspension 30 mL (has no administration in time range)    ED  Course/ Medical Decision Making/ A&P                           Medical Decision Making Patient was given a cola and asked to sip on this to try to dislodge any pill foreign body.  He drank alcohol can but did not have any significant improvement in the symptoms.  He was therefore sent for CT scan of his chest to assess for foreign body or other process that could explain his symptoms, CT imaging was unremarkable.  I suspect that he has some esophageal inflammation/irritation but appears the tablets have dissolved.  He was given a dose of Maalox which was somewhat helpful with his symptoms.  He will be prescribed Magic mouthwash for comfort while this inflammation heals.  Advised parent follow-up with his provider if symptoms persist or worsen.  Amount and/or Complexity of Data Reviewed Radiology: ordered.    Details: Per above.  Risk  OTC drugs.           Final Clinical Impression(s) / ED Diagnoses Final diagnoses:  Pill esophagitis    Rx / DC Orders ED Discharge Orders          Ordered    magic mouthwash w/lidocaine SOLN  4 times daily PRN        12/24/21 1141              Evalee Jefferson, PA-C 12/24/21 1403    Godfrey Pick, MD 12/24/21 1536

## 2021-12-24 NOTE — ED Triage Notes (Signed)
Pt states he took his medications this am and it feels like it is stuck in his throat

## 2021-12-24 NOTE — Discharge Instructions (Signed)
I suspect your symptoms will resolve spontaneously promptly over the next several days.  You may take the medication prescribed to help you with your symptoms while this esophageal irritation heals.

## 2022-01-28 DIAGNOSIS — M25561 Pain in right knee: Secondary | ICD-10-CM | POA: Diagnosis not present

## 2022-01-31 DIAGNOSIS — S8392XA Sprain of unspecified site of left knee, initial encounter: Secondary | ICD-10-CM | POA: Diagnosis not present

## 2022-01-31 DIAGNOSIS — M25562 Pain in left knee: Secondary | ICD-10-CM | POA: Diagnosis not present

## 2022-02-01 ENCOUNTER — Other Ambulatory Visit (HOSPITAL_COMMUNITY): Payer: Self-pay | Admitting: Family Medicine

## 2022-02-01 DIAGNOSIS — M25561 Pain in right knee: Secondary | ICD-10-CM

## 2022-02-01 DIAGNOSIS — S8391XA Sprain of unspecified site of right knee, initial encounter: Secondary | ICD-10-CM

## 2022-02-12 ENCOUNTER — Ambulatory Visit (HOSPITAL_COMMUNITY): Payer: 59

## 2022-02-14 ENCOUNTER — Other Ambulatory Visit: Payer: Self-pay | Admitting: Family Medicine

## 2022-02-14 DIAGNOSIS — M25561 Pain in right knee: Secondary | ICD-10-CM

## 2022-02-14 DIAGNOSIS — S8391XA Sprain of unspecified site of right knee, initial encounter: Secondary | ICD-10-CM

## 2022-02-25 ENCOUNTER — Ambulatory Visit
Admission: RE | Admit: 2022-02-25 | Discharge: 2022-02-25 | Disposition: A | Discharge status: Home / Self Care | Payer: 59 | Source: Ambulatory Visit | Attending: Family Medicine | Admitting: Family Medicine

## 2022-02-25 DIAGNOSIS — S8391XA Sprain of unspecified site of right knee, initial encounter: Secondary | ICD-10-CM

## 2022-02-25 DIAGNOSIS — M25561 Pain in right knee: Secondary | ICD-10-CM | POA: Diagnosis not present

## 2022-02-27 ENCOUNTER — Encounter (HOSPITAL_COMMUNITY): Payer: Self-pay

## 2022-02-27 ENCOUNTER — Ambulatory Visit (HOSPITAL_COMMUNITY): Payer: 59

## 2022-03-19 ENCOUNTER — Ambulatory Visit (INDEPENDENT_AMBULATORY_CARE_PROVIDER_SITE_OTHER): Payer: 59 | Admitting: Family Medicine

## 2022-03-19 ENCOUNTER — Encounter: Payer: Self-pay | Admitting: Family Medicine

## 2022-03-19 ENCOUNTER — Telehealth: Payer: Self-pay

## 2022-03-19 VITALS — BP 116/70 | HR 67 | Temp 98.8°F | Resp 17 | Ht 69.5 in | Wt 138.2 lb

## 2022-03-19 DIAGNOSIS — E7800 Pure hypercholesterolemia, unspecified: Secondary | ICD-10-CM | POA: Diagnosis not present

## 2022-03-19 DIAGNOSIS — H538 Other visual disturbances: Secondary | ICD-10-CM | POA: Diagnosis not present

## 2022-03-19 DIAGNOSIS — Z0000 Encounter for general adult medical examination without abnormal findings: Secondary | ICD-10-CM | POA: Diagnosis not present

## 2022-03-19 LAB — LIPID PANEL
Cholesterol: 178 mg/dL (ref 0–200)
HDL: 60.1 mg/dL (ref >39.00)
LDL Cholesterol: 99 mg/dL (ref 0–99)
NonHDL: 117.84
Total CHOL/HDL Ratio: 3
Triglycerides: 93 mg/dL (ref 0.0–149.0)
VLDL: 18.6 mg/dL (ref 0.0–40.0)

## 2022-03-19 LAB — HEPATIC FUNCTION PANEL
ALT: 35 U/L (ref 0–53)
AST: 26 U/L (ref 0–37)
Albumin: 4.7 g/dL (ref 3.5–5.2)
Alkaline Phosphatase: 52 U/L (ref 39–117)
Bilirubin, Direct: 0.2 mg/dL (ref 0.0–0.3)
Total Bilirubin: 0.9 mg/dL (ref 0.2–1.2)
Total Protein: 7 g/dL (ref 6.0–8.3)

## 2022-03-19 LAB — BASIC METABOLIC PANEL
BUN: 18 mg/dL (ref 6–23)
CO2: 28 mEq/L (ref 19–32)
Calcium: 9.9 mg/dL (ref 8.4–10.5)
Chloride: 102 mEq/L (ref 96–112)
Creatinine, Ser: 0.93 mg/dL (ref 0.40–1.50)
GFR: 107.03 mL/min (ref >60.00)
Glucose, Bld: 91 mg/dL (ref 70–99)
Potassium: 4.2 mEq/L (ref 3.5–5.1)
Sodium: 139 mEq/L (ref 135–145)

## 2022-03-19 LAB — CBC WITH DIFFERENTIAL/PLATELET
Basophils Absolute: 0.1 10*3/uL (ref 0.0–0.1)
Basophils Relative: 1.1 % (ref 0.0–3.0)
Eosinophils Absolute: 0.1 10*3/uL (ref 0.0–0.7)
Eosinophils Relative: 1.9 % (ref 0.0–5.0)
HCT: 43.7 % (ref 39.0–52.0)
Hemoglobin: 14.9 g/dL (ref 13.0–17.0)
Lymphocytes Relative: 31.6 % (ref 12.0–46.0)
Lymphs Abs: 2.1 10*3/uL (ref 0.7–4.0)
MCHC: 34.1 g/dL (ref 30.0–36.0)
MCV: 89.9 fl (ref 78.0–100.0)
Monocytes Absolute: 0.4 10*3/uL (ref 0.1–1.0)
Monocytes Relative: 6.2 % (ref 3.0–12.0)
Neutro Abs: 3.9 10*3/uL (ref 1.4–7.7)
Neutrophils Relative %: 59.2 % (ref 43.0–77.0)
Platelets: 238 10*3/uL (ref 150.0–400.0)
RBC: 4.86 Mil/uL (ref 4.22–5.81)
RDW: 13.4 % (ref 11.5–15.5)
WBC: 6.6 10*3/uL (ref 4.0–10.5)

## 2022-03-19 LAB — TSH: TSH: 0.99 u[IU]/mL (ref 0.35–5.50)

## 2022-03-19 NOTE — Progress Notes (Signed)
   Subjective:    Patient ID: Benjamin Reed, male    DOB: 20-Mar-1985, 36 y.o.   MRN: [id number]  HPI CPE- UTD on Tdap.  Declines flu.  Patient Care Team    Relationship Specialty Notifications Start End  Midge Minium, MD PCP - General Family Medicine  03/22/20   Lavena Bullion, DO Consulting Physician Gastroenterology  03/16/21   Tanda Rockers, MD Consulting Physician Pulmonary Disease  03/16/21      Health Maintenance  Topic Date Due   INFLUENZA VACCINE  04/29/2022 (Originally 08/29/2021)   COLONOSCOPY (Pts 45-68yr Insurance coverage will need to be confirmed)  06/16/2027   DTaP/Tdap/Td (2 - Td or Tdap) 03/17/2031   Hepatitis C Screening  Completed   HIV Screening  Completed   HPV VACCINES  Aged Out   COVID-19 Vaccine  Discontinued      Review of Systems Patient reports no hearing changes, anorexia, fever ,adenopathy, persistant/recurrent hoarseness, chest pain, palpitations, edema, persistant/recurrent cough, hemoptysis, dyspnea (rest,exertional, paroxysmal nocturnal), GU symptoms (dysuria, hematuria, voiding/incontinence issues) syncope, focal weakness, memory loss, numbness & tingling, skin/hair/nail changes, abnormal bruising, musculoskeletal symptoms/signs.   + depression- pt refusing treatment, not interested in medication or counseling.  + blurry vision R eye- due for eye exam  + pill dysphagia  + GERD- currently on Nexium and Famotidine  + epigastric pain  + BRBPR    Objective:   Physical Exam General Appearance:    Alert, cooperative, no distress, appears stated age  Head:    Normocephalic, without obvious abnormality, atraumatic  Eyes:    PERRL, conjunctiva/corneas clear, EOM's intact both eyes       Ears:    Normal TM's and external ear canals, both ears  Nose:   Nares normal, septum midline, mucosa normal, no drainage   or sinus tenderness  Throat:   Lips, mucosa, and tongue normal; teeth and gums normal  Neck:   Supple, symmetrical, trachea  midline, no adenopathy;       thyroid:  No enlargement/tenderness/nodules  Back:     Symmetric, no curvature, ROM normal, no CVA tenderness  Lungs:     Clear to auscultation bilaterally, respirations unlabored  Chest wall:    No tenderness or deformity  Heart:    Regular rate and rhythm, S1 and S2 normal, no murmur, rub   or gallop  Abdomen:     Soft, non-tender, bowel sounds active all four quadrants,    no masses, no organomegaly  Genitalia:    deferred  Rectal:    Extremities:   Extremities normal, atraumatic, no cyanosis or edema  Pulses:   2+ and symmetric all extremities  Skin:   Skin color, texture, turgor normal, no rashes or lesions  Lymph nodes:   Cervical, supraclavicular, and axillary nodes normal  Neurologic:   CNII-XII intact. Normal strength, sensation and reflexes      throughout          Assessment & Plan:

## 2022-03-19 NOTE — Patient Instructions (Addendum)
Follow up in 1 year or as needed We'll notify you of your lab results and make any changes if needed We'll call you to schedule your eye exam Please call and schedule an appt with GI [phone] as there are lots of things to discuss! If you change your mind about mood/depression medication- please let me know! Call with any questions or concerns Stay Safe!  Stay Healthy!

## 2022-03-19 NOTE — Assessment & Plan Note (Signed)
Pt's PE WNL.  UTD on Tdap, colonoscopy, declines flu.  Pt w/ multiple GI complaints- needs f/u.  He was encouraged to schedule ASAP.  Referral made for eye exam due to blurry vision.  Pt declines tx for depression at this time.  Check labs.  Anticipatory guidance provided.

## 2022-03-19 NOTE — Telephone Encounter (Signed)
Pt seen results Via my chart  

## 2022-03-19 NOTE — Telephone Encounter (Signed)
-----   Message from Midge Minium, MD sent at 03/19/2022  3:31 PM EST ----- Labs look great!  No changes at this time

## 2022-03-23 ENCOUNTER — Encounter: Payer: Self-pay | Admitting: Gastroenterology

## 2022-05-08 ENCOUNTER — Ambulatory Visit (INDEPENDENT_AMBULATORY_CARE_PROVIDER_SITE_OTHER): Payer: 59 | Admitting: Family Medicine

## 2022-05-08 VITALS — BP 122/70 | HR 78 | Temp 98.8°F | Ht 69.5 in | Wt 141.4 lb

## 2022-05-08 DIAGNOSIS — K047 Periapical abscess without sinus: Secondary | ICD-10-CM

## 2022-05-08 DIAGNOSIS — H9203 Otalgia, bilateral: Secondary | ICD-10-CM | POA: Diagnosis not present

## 2022-05-08 MED ORDER — DOXYCYCLINE HYCLATE 100 MG PO TABS: 100.0000 mg | ORAL_TABLET | Freq: Two times a day (BID) | ORAL | 0 refills | Status: AC

## 2022-05-08 MED ORDER — FLUTICASONE PROPIONATE 50 MCG/ACT NA SUSP: 2.0000 | Freq: Every day | NASAL | 6 refills | Status: AC

## 2022-05-08 NOTE — Patient Instructions (Addendum)
-  It was a pleasure to meet you -START to use Flonase for bilateral ear pain for 2 weeks. If not improvement, call back to office. Will send to ENT.  -START Doxycycline for dental abscess. Recommend to follow up with a dentist for dental abscess.

## 2022-05-08 NOTE — Progress Notes (Signed)
Acute Office Visit   Subjective:  Patient ID: Benjamin Reed, male    DOB: 19-Dec-1985, 37 y.o.   MRN: [id number]  Chief Complaint  Patient presents with   Jaw Pain    Pt notes bilateral ear pain starting yesterday as well as jaw swelling and seems worse today, pt denied use of any new products/foods, some ringing in the ear but no decreased hearing     HPI Patient is a 37 year old caucasian male that presents with bilateral ear pain, tinnitus, and left jaw swelling.  Started yesterday, but got worse today.  Patient reports he has tried OTC Tylenol and ice compresses, with no relief.  Jaw swelling has occurred previously in Jan 2023 for dental abscess.  Denies fever, changes of hearing, besides tinnitus, sore throat, and sinus congestion or drainage.  Reports daily headaches with having been more often since yesterday.  ROS See HPI above     Objective:   BP 122/70   Pulse 78   Temp 98.8 F (37.1 C) (Temporal)   Ht 5' 9.5" (1.765 m)   Wt 141 lb 6.4 oz (64.1 kg)   SpO2 98%   BMI 20.58 kg/m    Physical Exam Vitals reviewed.  Constitutional:      General: He is not in acute distress.    Appearance: Normal appearance. He is not ill-appearing, toxic-appearing or diaphoretic.  HENT:     Head: Normocephalic and atraumatic.     Right Ear: Tympanic membrane, ear canal and external ear normal. There is no impacted cerumen.     Left Ear: Tympanic membrane, ear canal and external ear normal. There is no impacted cerumen.     Ears:     Comments: Minimal amount of cerumen deep in the right canal.     Mouth/Throat:     Dentition: Gingival swelling, dental caries and dental abscesses present.     Comments: Swelling and inflamed along the lower gum line.  Eyes:     General:        Right eye: No discharge.        Left eye: No discharge.     Conjunctiva/sclera: Conjunctivae normal.  Cardiovascular:     Rate and Rhythm: Normal rate and regular rhythm.     Heart sounds: Normal  heart sounds. No murmur heard.    No friction rub. No gallop.  Pulmonary:     Effort: Pulmonary effort is normal. No respiratory distress.     Breath sounds: Normal breath sounds.  Musculoskeletal:        General: Normal range of motion.  Skin:    General: Skin is warm and dry.  Neurological:     General: No focal deficit present.     Mental Status: He is alert and oriented to person, place, and time. Mental status is at baseline.  Psychiatric:        Mood and Affect: Mood normal.        Behavior: Behavior normal.        Thought Content: Thought content normal.        Judgment: Judgment normal.      Assessment & Plan:  Dental infection -     Doxycycline Hyclate; Take 1 tablet (100 mg total) by mouth 2 (two) times daily for 10 days.  Dispense: 20 tablet; Refill: 0  Acute ear pain, bilateral -     Fluticasone Propionate; Place 2 sprays into both nostrils daily.  Dispense: 16 g; Refill: 6  -No  concerns found or reason for bilateral ear pain. Could be from seasonal allergies.  Recommend to start to use Flonase for bilateral ear pain for 2 weeks. If not improvement, call back to office. Will send to ENT.  -START Doxycycline for dental abscess. Recommend to follow up with a dentist for dental abscess.  Provided information about dental abscess. Recommend Tylenol 1000mg  every 8 hours for pain. Recommend heat or cold compresses 4-6 times a day, 20 minutes at a time for pain and swelling.  No follow-ups on file.  Zandra Abts, NP

## 2022-05-15 ENCOUNTER — Telehealth: Payer: Self-pay

## 2022-05-15 ENCOUNTER — Ambulatory Visit: Payer: 59 | Admitting: Gastroenterology

## 2022-05-15 ENCOUNTER — Encounter: Payer: Self-pay | Admitting: Gastroenterology

## 2022-05-15 VITALS — BP 116/68 | HR 71 | Ht 69.5 in | Wt 142.5 lb

## 2022-05-15 DIAGNOSIS — K582 Mixed irritable bowel syndrome: Secondary | ICD-10-CM | POA: Diagnosis not present

## 2022-05-15 DIAGNOSIS — K649 Unspecified hemorrhoids: Secondary | ICD-10-CM

## 2022-05-15 DIAGNOSIS — R1013 Epigastric pain: Secondary | ICD-10-CM

## 2022-05-15 DIAGNOSIS — K219 Gastro-esophageal reflux disease without esophagitis: Secondary | ICD-10-CM | POA: Diagnosis not present

## 2022-05-15 DIAGNOSIS — Z8719 Personal history of other diseases of the digestive system: Secondary | ICD-10-CM | POA: Diagnosis not present

## 2022-05-15 DIAGNOSIS — K449 Diaphragmatic hernia without obstruction or gangrene: Secondary | ICD-10-CM | POA: Diagnosis not present

## 2022-05-15 DIAGNOSIS — K921 Melena: Secondary | ICD-10-CM | POA: Diagnosis not present

## 2022-05-15 DIAGNOSIS — Z86010 Personal history of colonic polyps: Secondary | ICD-10-CM | POA: Diagnosis not present

## 2022-05-15 DIAGNOSIS — R12 Heartburn: Secondary | ICD-10-CM

## 2022-05-15 MED ORDER — HYOSCYAMINE SULFATE 0.125 MG PO TABS: 0.1250 mg | ORAL_TABLET | Freq: Four times a day (QID) | ORAL | 3 refills | Status: AC | PRN

## 2022-05-15 NOTE — Telephone Encounter (Signed)
Can we obtain PA for Levsin 0.125 MG Tablet for this patient please?

## 2022-05-15 NOTE — Patient Instructions (Addendum)
You have been scheduled for an endoscopy with Bravo. Please follow written instructions given to you at your visit today. If you use inhalers (even only as needed), please bring them with you on the day of your procedure.   We have sent the following medications to your pharmacy for you to pick up at your convenience: Levsin 0.125 MG  You will be contacted by [facility] to schedule a consult.  [facility] is located at [address], [address].   Phone No# [phone].   _______________________________________________________  If your blood pressure at your visit was 140/90 or greater, please contact your primary care physician to follow up on this.  _______________________________________________________  If you are age 35 or younger, your body mass index should be between 19-25. Your Body mass index is 20.74 kg/m. If this is out of the aformentioned range listed, please consider follow up with your Primary Care Provider.   __________________________________________________________  The [facility] providers would like to encourage you to use [facility] to communicate with providers for non-urgent requests or questions.  Due to long hold times on the telephone, sending your provider a message by [facility] may be a faster and more efficient way to get a response.  Please allow 48 business hours for a response.  Please remember that this is for non-urgent requests.   Due to recent changes in healthcare laws, you may see the results of your imaging and laboratory studies on MyChart before your provider has had a chance to review them.  We understand that in some cases there may be results that are confusing or concerning to you. Not all laboratory results come back in the same time frame and the provider may be waiting for multiple results in order to interpret others.  Please give [place] 48 hours in order for your provider to thoroughly review all the results before  contacting the office for clarification of your results.        Thank you for choosing me and [facility].  Vito Cirigliano, D.O. .dottie

## 2022-05-15 NOTE — Progress Notes (Signed)
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  He reports a history of Eosinophilic Esophagitis on EGD while in the [employer] in 2009.  He was treated with an inhaler for several years without significant improvement.  EGD by Dr. Loreta Ave in 2012 unremarkable patient.  Had been taking omeprazole 40 mg/day for 4+ years, then increased to BID in 04/2020 without significant improvement.  Repeat EGD 05/2020 largely unremarkable as outlined below.  Esophageal Manometry in 08/2020 with slightly elevated LES pressure (47) but normal relaxation and IRP with normal motility.  2) IBS mixed type.  Historically diarrhea predominant, but more recently with mixed pattern stools.  Complicated by C. difficile infection in 04/2020, treated with vancomycin and no e/o CDI on colonoscopy in 05/2020.  Does have associated abdominal bloating and lower abdominal pain.  3) History of anal fissure and hemorrhoids with prior hemorrhoidal surgery in 2013      Endoscopic History: -EGD 2009 while in the [employer] reportedly notable for EOE per patient.  No report available for review - Colonoscopy 2009 while in the [employer] normal per patient.  No report available for review - EGD (01/2010, Dr. Loreta Ave): No report available for review.  Previously requested records - Colonoscopy (01/2010, Dr. Loreta Ave): Internal hemorrhoids, otherwise normal - EGD (05/2020): Stenosis at 39 cm dilated with 20 mm TTS without mucosal rent then fractured with forceps (path: Normal).  Remainder of esophagus normal (path: Normal).  Small sliding HH, normal stomach (path benign), normal duodenum (path benign) - Colonoscopy (05/2020): 5 mm sigmoid polyp (TA), normal colon mucosa (biopsies negative for MC, IBD), scar in distal rectum from prior hemorrhoid surgery, small grade 1  hemorrhoids.  Repeat in 7 years  HPI:     Patient is a 37 y.o. male presenting to the Gastroenterology Clinic for evaluation of abdominal pain, GERD, blood in stools.  Was last seen in the GI clinic on 07/25/2020.  Main issue today is upper abdominal pain.  Describes as "5.5/10 today". Pain "is always there" and times where it exacerbates for a few days.  No clear inciting factors.  Separately, has been having worsening reflux symptoms, described as constant burning in his chest. Taking Nexium 40 mg BID and Pepcid 10-20 mg daily on-demand. Can have occasional nocturnal sxs, and will wake most mornings with breakthrough heartburn which can linger throughout the day.  No dysphagia.   Still with mixed bowel habits, alternating between diarrhea and constipation.   Lastly, continues to have intermittent blood in stools.   Presents to the office with his mother.      Latest Ref Rng & Units 03/19/2022    8:08 AM 03/16/2021    1:48 PM 05/05/2020   11:00 AM  CBC  WBC 4.0 - 10.5 K/uL 6.6  10.2  5.6   Hemoglobin 13.0 - 17.0 g/dL 21.3  08.6  57.8   Hematocrit 39.0 - 52.0 % 43.7  41.2  46.2   Platelets 150.0 - 400.0 K/uL 238.0  263.0  228       Latest Ref Rng & Units 03/19/2022    8:08 AM 03/16/2021    1:48 PM 05/05/2020   11:00 AM  BMP  Glucose 70 - 99 mg/dL 91  469  629   BUN 6 - 23 mg/dL 18  10  13   Creatinine 0.40 - 1.50 mg/dL 1.61  0.96  0.45   Sodium 135 - 145 mEq/L 139  141  139   Potassium 3.5 - 5.1 mEq/L 4.2  4.2  3.9   Chloride 96 - 112 mEq/L 102  101  102   CO2 19 - 32 mEq/L 28  36  28   Calcium 8.4 - 10.5 mg/dL 9.9  9.6  9.6       Latest Ref Rng & Units 03/19/2022    8:08 AM 03/16/2021    1:48 PM 03/25/2020    2:57 PM  Hepatic Function  Total Protein 6.0 - 8.3 g/dL 7.0  6.8  7.6   Albumin 3.5 - 5.2 g/dL 4.7  4.7  5.0   AST 0 - 37 U/L ALT 0 - 53 U/L 35  25  17   Alk Phosphatase 39 - 117 U/L 52  43  45   Total Bilirubin 0.2 - 1.2 mg/dL 0.9  0.6  0.9   Bilirubin,  Direct 0.0 - 0.3 mg/dL 0.2  0.1  0.2      Review of systems:     No chest pain, no SOB, no fevers, no urinary sx   Past Medical History:  Diagnosis Date   Allergy    Anal fissure    Anxiety    Bronchitis    Burns by, chemical    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   Nasal congestion 12/09/2012   Pneumonia    Seizure    as a baby d/t fevers   Stroke    medication induced from Zoloft    Patient's surgical history, family medical history, social history, medications and allergies were all reviewed in Epic    Current Outpatient Medications  Medication Sig Dispense Refill   acetaminophen (TYLENOL) 500 MG tablet Take 500-1,000 mg by mouth as needed.     cetirizine (ZYRTEC) 10 MG tablet Take 1 tablet (10 mg total) by mouth daily. 90 tablet 1   Cholecalciferol (VITAMIN D3 PO) Take 1 tablet by mouth 2 (two) times daily.     doxycycline (VIBRA-TABS) 100 MG tablet Take 1 tablet (100 mg total) by mouth 2 (two) times daily for 10 days. 20 tablet 0   esomeprazole (NEXIUM) 40 MG capsule Take 20 mg by mouth 2 (two) times daily before a meal.     famotidine (PEPCID) 20 MG tablet One after supper (Patient taking differently: Take 20 mg by mouth daily. One after supper) 30 tablet 11   fluticasone (FLONASE) 50 MCG/ACT nasal spray Place 2 sprays into both nostrils daily. 16 g 6   vitamin C (ASCORBIC ACID) 500 MG tablet Take 500 mg by mouth daily.     No current facility-administered medications for this visit.    Physical Exam:     Ht 5' 9.5" (1.765 m)   Wt 142 lb 8 oz (64.6 kg)   BMI 20.74 kg/m   GENERAL:  Pleasant male in NAD PSYCH: : Cooperative, normal affect ABDOMEN:  TTP in MEG.  No rebound, guarding, peritoneal signs. Nondistended, soft. No obvious masses, no  hepatomegaly,  normal bowel sounds SKIN:  turgor, no  lesions seen Musculoskeletal:  Normal muscle tone, normal strength NEURO: Alert and oriented x 3, no focal neurologic deficits   IMPRESSION and PLAN:    1) Epigastric pain Discussed broad DDx for his epigastric pain, to include atypical manifestation of uncontrolled reflux, PUD, gastritis, or possibly related to his underlying IBS.  Plan for the following: - EGD to evaluate for mucosal/luminal pathology with random and directed biopsies - Trial course of Levsin - Continue current high-dose PPI therapy - If EGD unrevealing, trial FD guard  2) Heartburn 3) GERD 4) Hiatal hernia Worsening reflux symptoms despite high-dose acid suppression therapy.  Unclear etiology, but discussed DDx to include uncontrolled esophageal acid exposure, nonacid reflux, esophageal hypersensitivity, etc.  Discussed diagnostic options to include Bravo and transnasal pH/impedance studies.  Discussed the pros/cons of each of these studies, along with the pros/cons of performing the studies on vs off acid suppression therapy, and ultimately decided on the following together: - EGD with Bravo placement to be done on PPI therapy to evaluate for breakthrough esophageal acid exposure - Evaluate for erosive esophagitis, LES laxity, hiatal hernia time of EGD - If EGD and Bravo unremarkable, discussed possibly referring for transnasal catheter pH/impedance off acid suppression therapy  5) IBS mixed type - Trial of Levsin as above - Fiber supplement  6) Symptomatic hemorrhoids Intermittent symptomatic hemorrhoids despite previous hemorrhoid surgery and appropriate scarring in the distal rectum noted on colonoscopy.  Intolerant to hemorrhoid banding in the past. - Referral to colorectal surgery  7) History of colon polyps - Repeat colonoscopy in 2029 for ongoing polyp surveillance  8) History of esophageal stricture - Nonobstructive stricture on previous EGD.   Evaluate for recurrence and need for esophageal dilation at time of EGD as above  The indications, risks, and benefits of EGD were explained to the patient in detail. Risks include but are not limited to bleeding, perforation, adverse reaction to medications, and cardiopulmonary compromise. Sequelae include but are not limited to the possibility of surgery, hospitalization, and mortality. The patient verbalized understanding and wished to proceed. All questions answered, referred to scheduler. Further recommendations pending results of the exam.    Verlin Dike Torrance Frech ,DO, FACG 05/15/2022, 1:28 PM

## 2022-05-18 ENCOUNTER — Encounter: Payer: Self-pay | Admitting: Gastroenterology

## 2022-05-18 ENCOUNTER — Ambulatory Visit (AMBULATORY_SURGERY_CENTER): Payer: 59 | Admitting: Gastroenterology

## 2022-05-18 VITALS — BP 110/82 | HR 69 | Temp 98.4°F | Resp 14 | Ht 69.0 in | Wt 142.0 lb

## 2022-05-18 DIAGNOSIS — R1013 Epigastric pain: Secondary | ICD-10-CM | POA: Diagnosis not present

## 2022-05-18 DIAGNOSIS — K2970 Gastritis, unspecified, without bleeding: Secondary | ICD-10-CM | POA: Diagnosis not present

## 2022-05-18 DIAGNOSIS — F32A Depression, unspecified: Secondary | ICD-10-CM | POA: Diagnosis not present

## 2022-05-18 DIAGNOSIS — K219 Gastro-esophageal reflux disease without esophagitis: Secondary | ICD-10-CM

## 2022-05-18 DIAGNOSIS — K449 Diaphragmatic hernia without obstruction or gangrene: Secondary | ICD-10-CM

## 2022-05-18 DIAGNOSIS — K2950 Unspecified chronic gastritis without bleeding: Secondary | ICD-10-CM

## 2022-05-18 DIAGNOSIS — R12 Heartburn: Secondary | ICD-10-CM | POA: Diagnosis not present

## 2022-05-18 DIAGNOSIS — F419 Anxiety disorder, unspecified: Secondary | ICD-10-CM | POA: Diagnosis not present

## 2022-05-18 MED ORDER — SODIUM CHLORIDE 0.9 % IV SOLN
500.0000 mL | Freq: Once | INTRAVENOUS | Status: DC
Start: 2022-05-18 — End: 2022-05-18

## 2022-05-18 NOTE — Progress Notes (Signed)
Uneventful anesthetic. Report to pacu rn. Vss. Care resumed by rn. 

## 2022-05-18 NOTE — Progress Notes (Signed)
GASTROENTEROLOGY PROCEDURE H&P NOTE   Primary Care Physician: Sheliah Hatch, MD    Reason for Procedure:  Upper abdominal pain, GERD, pyrosis  Plan:    EGD with Bravo placement (on PPI)  Patient is appropriate for endoscopic procedure(s) in the ambulatory ([facility]) setting.  The nature of the procedure, as well as the risks, benefits, and alternatives were carefully and thoroughly reviewed with the patient. Ample time for discussion and questions allowed. The patient understood, was satisfied, and agreed to proceed.     HPI: Benjamin Reed is a 37 y.o. male who presents for EGD with Bravo placement for further evaluation of continued reflux symptoms (heartburn) despite high-dose acid suppression therapy (Nexium 40 mg bid, Pepcid 10-20 mg daily on demand), along with endoscopic evaluation for epigastric pain.  Still taking all of his acid suppression therapy today with plan for Bravo placement on therapy to evaluate for breakthrough.  Does have history of hiatal hernia and nonobstructive distal esophageal stricture previously dilated 20 mm TTS balloon.  Otherwise no changes in clinical history since last appointment on 05/15/2022.  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   Nasal congestion 12/09/2012   Pneumonia    Seizure    as a baby d/t fevers   Stroke    medication induced from Zoloft    Past Surgical History:  Procedure Laterality Date   COLONOSCOPY     ESOPHAGEAL MANOMETRY N/A 08/31/2020   Procedure: ESOPHAGEAL MANOMETRY (EM);  Surgeon: Shellia Cleverly, DO;  Location: [facility];  Service: Gastroenterology;  Laterality: N/A;    HEMORRHOID SURGERY  04/12/2010   procedure for prolapse and hemorrhoids   KNEE ARTHROSCOPY Left 2013   KNEE ARTHROSCOPY Right 12/16/2012   Procedure: RIGHT ARTHROSCOPY KNEE;  Surgeon: Velna Ochs, MD;  Location: [facility];  Service: Orthopedics;  Laterality: Right;   UPPER GASTROINTESTINAL ENDOSCOPY      Prior to Admission medications   Medication Sig Start Date End Date Taking? Authorizing Provider  acetaminophen (TYLENOL) 500 MG tablet Take 500-1,000 mg by mouth as needed.   Yes [provider]  cetirizine (ZYRTEC) 10 MG tablet Take 1 tablet (10 mg total) by mouth daily. 10/16/21  Yes Sheliah Hatch, MD  Cholecalciferol (VITAMIN D3 PO) Take 1 tablet by mouth 2 (two) times daily.   Yes [provider]  doxycycline (VIBRA-TABS) 100 MG tablet Take 1 tablet (100 mg total) by mouth 2 (two) times daily for 10 days. 05/08/22 05/18/22 Yes Alveria Apley, NP  esomeprazole (NEXIUM) 40 MG capsule Take 20 mg by mouth 2 (two) times daily before a meal.   Yes [provider]  famotidine (PEPCID) 20 MG tablet One after supper Patient taking differently: Take 20 mg by mouth daily. One after supper 05/05/20  Yes Nyoka Cowden, MD  fluticasone [facility]) 50 MCG/ACT nasal spray Place 2 sprays into both nostrils daily. 05/08/22  Yes Alveria Apley, NP  hyoscyamine (LEVSIN) 0.125 MG tablet Take 1 tablet (0.125 mg total) by mouth every  6 (six) hours as needed for cramping (diarrhea,nausea). 05/15/22  Yes Karrine Kluttz V, DO  vitamin C (ASCORBIC ACID) 500 MG tablet Take 500 mg by mouth daily.   Yes [provider]    Current Outpatient Medications  Medication Sig Dispense Refill   acetaminophen (TYLENOL) 500 MG tablet Take 500-1,000 mg by mouth as needed.     cetirizine (ZYRTEC) 10 MG tablet Take 1 tablet (10 mg total) by mouth daily. 90 tablet 1   Cholecalciferol (VITAMIN D3 PO) Take 1 tablet by mouth 2 (two) times daily.     doxycycline  (VIBRA-TABS) 100 MG tablet Take 1 tablet (100 mg total) by mouth 2 (two) times daily for 10 days. 20 tablet 0   esomeprazole (NEXIUM) 40 MG capsule Take 20 mg by mouth 2 (two) times daily before a meal.     famotidine (PEPCID) 20 MG tablet One after supper (Patient taking differently: Take 20 mg by mouth daily. One after supper) 30 tablet 11   fluticasone (FLONASE) 50 MCG/ACT nasal spray Place 2 sprays into both nostrils daily. 16 g 6   hyoscyamine (LEVSIN) 0.125 MG tablet Take 1 tablet (0.125 mg total) by mouth every 6 (six) hours as needed for cramping (diarrhea,nausea). 30 tablet 3   vitamin C (ASCORBIC ACID) 500 MG tablet Take 500 mg by mouth daily.     Current Facility-Administered Medications  Medication Dose Route Frequency Provider Last Rate Last Admin   0.9 %  sodium chloride infusion  500 mL Intravenous Once Lindsey Demonte V, DO        Allergies as of 05/18/2022 - Review Complete 05/18/2022  Allergen Reaction Noted   Zoloft [sertraline hcl] Other (See Comments) 12/09/2012   Cephalexin Hives 03/21/2010   Other Other (See Comments) 12/09/2012   Nsaids  02/08/2016    Family History  Problem Relation Age of Onset   Hyperlipidemia Mother        GPs   Hypertension Mother        GPs   Clotting disorder Mother    Heart disease Mother    Prostate cancer Paternal Grandfather    Heart disease Paternal Grandfather    Colon polyps Paternal Grandfather    Colon cancer Other        MGGM dx in her 70s to 40s   Colon polyps Other        GF dx in his late 45s, aunt   Barrett's esophagus Brother    Cleft palate Brother    Colon polyps Maternal Grandmother    Diabetes Maternal Grandmother    Hypertension Maternal Grandmother    Colon polyps Maternal Grandfather    Clotting disorder Maternal Grandfather    Heart disease Maternal Grandfather    Hypertension Maternal Grandfather    Clotting disorder Maternal Uncle    Hypertension Maternal Uncle    Heart disease Paternal Uncle         x 2   Colon polyps Maternal Aunt    Esophageal cancer Neg Hx    Rectal cancer Neg Hx    Stomach cancer Neg Hx     Social History   Socioeconomic History   Marital status: Divorced    Spouse name: Not on file   Number of children: 2   Years of education: Not on file   Highest education level: Not on file  Occupational History   Not on file  Tobacco Use   Smoking status: Former    Packs/day: 0.50    Years: 23.00  Additional pack years: 0.00    Total pack years: 11.50    Types: Cigarettes    Quit date: 01/30/2019    Years since quitting: 3.2   Smokeless tobacco: Former    Types: Chew    Quit date: 2021  Vaping Use   Vaping Use: Never used  Substance and Sexual Activity   Alcohol use: Not Currently    Comment: weekly prior to c/o x today   Drug use: No   Sexual activity: Not Currently  Other Topics Concern   Not on file  Social History Narrative   Was in the army.    Social Determinants of Health   Financial Resource Strain: Not on file  Food Insecurity: Not on file  Transportation Needs: Not on file  Physical Activity: Not on file  Stress: Not on file  Social Connections: Not on file  Intimate Partner Violence: Not on file    Physical Exam: Vital signs in last 24 hours: @BP  102/62   Pulse 64   Temp 98.4 F (36.9 C)   Ht 5\' 9"  (1.753 m)   Wt 142 lb (64.4 kg)   SpO2 100%   BMI 20.97 kg/m  GEN: NAD EYE: Sclerae anicteric ENT: MMM CV: Non-tachycardic Pulm: CTA b/l GI: Soft, NT/ND NEURO:  Alert & Oriented x 3   Doristine Locks, DO [facility]   05/18/2022 7:48 AM

## 2022-05-18 NOTE — Patient Instructions (Signed)
Resume previous diet. Continue present medications. Await pathology results. Return Bravo monitor May 21, 2022 by 3 pm.    Post-op Bravo pH instructions Once you get home:  Eat normally and go about your daily routine/activities Limit drinking fluids or eating between meals Do not chew gum or eat hard candy DO NOT take any antacid or anti-reflux medications during the 48-hour monitoring time, unless instructed by your physician  Recording events: Events to be recorded are:  Record using event buttons on recorder and write on paper diary form Every time you eat or drink something (other than water) 2.   Periods of lying down/reclining 3.  Symptoms:  may include heartburn, regurgitation, chest pain, cough or specify if other.  A paper diary is also provided to record the times of your reflux symptoms and times for meals and when you lie down.  The recorder needs to remain within 3 feet (arms length) of you during the testing period (48 hours). If you should forget and move outside of a 3-foot radius of the receiver you may hear beeping and you will see a "C1" error in the display window on the top of the receiver.  Please pick up the receiver and hold close to you to re-establish the connection and the error message disappears.  You may take a bath/shower during the testing period, but the recorder must not get wet and must remain within 3 feet of you. Please leave the receiver outside of the shower or tub while bathing. The monitoring period will be for 48 hours after placement of the capsule.  At the end of the 48 hours, you will return the recorder, and your diary, to our [facility] front desk.  A nurse will meet you to collect the device and answer any questions you may have.  The device should turn off once the 48 hours is complete.   What to expect after placement of the capsule:  Some patients experience a vague sensation that something is in their esophagus or  that they 'feel' the capsule when they swallow food.  Should you experience this, chewing food carefully or drinking liquids may minimize this sensation.   After the test is complete, the disposable capsule will fall off the wall of your esophagus within 5-10 days and pass naturally with your bowel movement through the digestive tract.  Once the recorder is returned, your provider will review and interpret your recordings and contact you to discuss your results.  This may take up to two weeks.   DO NOT have an MRI for 30 days after your procedure to ensure the capsule is no longer inside your body  It is imperative that you return the recorder on _________________________ by 3:00pm.  Your information must be downloaded at this time to obtain your results.

## 2022-05-18 NOTE — Progress Notes (Signed)
Called to room to assist during endoscopic procedure.  Patient ID and intended procedure confirmed with present staff. Received instructions for my participation in the procedure from the performing physician.  

## 2022-05-18 NOTE — Op Note (Signed)
[facility] Patient Name: Benjamin Reed Procedure Date: 05/18/2022 7:59 AM MRN: [id number] Endoscopist: Doristine Locks , MD, [id number] Age: 37 Referring MD:  Date of Birth: 1985/12/16 Gender: Male Account #: [id number] Procedure:                Upper GI endoscopy w/ biopsy and BRAVO placement                            (ON PPI) Indications:              Epigastric abdominal pain, Heartburn, Follow-up of                            esophageal reflux                           37 yo male with history of GERD with persistent                            breakthrough heartburn despite high dose acid                            suppression therapy (Nexium 40 mg BID, Pepcid 10-20                            mg daily on-demand). Also with daily epigastric                            pain. Medicines:                Monitored Anesthesia Care Procedure:                Pre-Anesthesia Assessment:                           - Prior to the procedure, a History and Physical                            was performed, and patient medications and                            allergies were reviewed. The patient's tolerance of                            previous anesthesia was also reviewed. The risks                            and benefits of the procedure and the sedation                            options and risks were discussed with the patient.                            All questions were answered, and informed consent  was obtained. Prior Anticoagulants: The patient has                            taken no anticoagulant or antiplatelet agents. ASA                            Grade Assessment: II - A patient with mild systemic                            disease. After reviewing the risks and benefits,                            the patient was deemed in satisfactory condition to                            undergo the procedure.                           After obtaining  informed consent, the endoscope was                            passed under direct vision. Throughout the                            procedure, the patient's blood pressure, pulse, and                            oxygen saturations were monitored continuously. The                            GIF [id number] #[id number] was introduced through the                            mouth, and advanced to the second part of duodenum.                            The upper GI endoscopy was accomplished without                            difficulty. The patient tolerated the procedure                            well. Scope In: Scope Out: Findings:                 The examined esophagus was normal.                           The Z-line was regular and was found 40 cm from the                            incisors. The BRAVO capsule with delivery system                            was  introduced through the mouth and advanced into                            the esophagus, such that the BRAVO pH capsule was                            positioned 34 cm from the incisors, which was 6 cm                            proximal to the GE junction. The BRAVO pH capsule                            was then deployed and attached to the esophageal                            mucosa. The delivery system was then withdrawn.                            Endoscopy was utilized for probe placement and                            diagnostic evaluation. The scope was reinserted to                            evaluate placement of the BRAVO capsule.                            Visualization showed the BRAVO capsule to be in an                            appropriate position.                           A 1 cm sliding type hiatal hernia was present.                           The gastroesophageal flap valve was visualized                            endoscopically and classified as Hill Grade III                            (minimal fold, loose to  endoscope, hiatal hernia                            likely).                           Localized minimal inflammation characterized by                            erythema was found at the incisura and in the  prepyloric region of the stomach. Biopsies were                            taken with a cold forceps for histology. Estimated                            blood loss was minimal.                           The gastric fundus and gastric body were normal.                            Additional mucosal biopsies were taken with a cold                            forceps for Helicobacter pylori testing. Estimated                            blood loss was minimal.                           The examined duodenum was normal. Complications:            No immediate complications. Estimated Blood Loss:     Estimated blood loss was minimal. Impression:               - Normal esophagus.                           - Z-line regular, 40 cm from the incisors.                           - 1 cm hiatal hernia.                           - Gastroesophageal flap valve classified as Hill                            Grade III (minimal fold, loose to endoscope, hiatal                            hernia likely).                           - Gastritis. Biopsied.                           - Normal gastric fundus and gastric body. Biopsied.                           - Normal examined duodenum.                           - The BRAVO pH capsule was deployed. Recommendation:           - Patient has a contact number available for  emergencies. The signs and symptoms of potential                            delayed complications were discussed with the                            patient. Return to normal activities tomorrow.                            Written discharge instructions were provided to the                            patient.                           - Resume  previous diet.                           - Continue present medications.                           - Await pathology results.                           - Will follow-up on BRAVO results. Doristine Locks, MD 05/18/2022 8:17:00 AM

## 2022-05-18 NOTE — Progress Notes (Signed)
Pt's states no medical or surgical changes since previsit or office visit. 

## 2022-05-18 NOTE — Telephone Encounter (Signed)
Can we obtain PA for Levsin 0.125 MG Tablet for this patient please?  Thank you!

## 2022-05-18 NOTE — Progress Notes (Signed)
Capsule expiration date-  05/28/23 Capsule ID number [id number]  LES measurement: 40 (capsule placed 6 cm above LES) 34  Time of implant: 0910

## 2022-05-18 NOTE — Progress Notes (Signed)
S Monday,RN reviewed Bravo instructions with pt in admitting .

## 2022-05-21 NOTE — Telephone Encounter (Signed)
  Follow up Call-    Row Labels 05/18/2022    7:16 AM 06/15/2020    1:17 PM  Call back number   Section Header. No data exists in this row.    Post procedure Call Back phone  #   [phone]  Permission to leave phone message   Yes Yes     Patient questions:  Do you have a fever, pain , or abdominal swelling? No. Pain Score  0 *  Have you tolerated food without any problems? Yes.    Have you been able to return to your normal activities? Yes.    Do you have any questions about your discharge instructions: Diet   No. Medications  No. Follow up visit  No.  Do you have questions or concerns about your Care? No.  Actions: * If pain score is 4 or above: No action needed, pain <4.

## 2022-05-22 ENCOUNTER — Telehealth: Payer: Self-pay | Admitting: Gastroenterology

## 2022-05-22 ENCOUNTER — Telehealth: Payer: Self-pay

## 2022-05-22 ENCOUNTER — Other Ambulatory Visit (HOSPITAL_COMMUNITY): Payer: Self-pay

## 2022-05-22 DIAGNOSIS — R21 Rash and other nonspecific skin eruption: Secondary | ICD-10-CM

## 2022-05-22 NOTE — Telephone Encounter (Signed)
PA submitted and pending, will be updated in additional encounter created

## 2022-05-22 NOTE — Telephone Encounter (Signed)
PA request received via provider for Hyoscyamine Sulfate 0.125MG  tablets  PA has been submitted to [employer] via CMM and is pending determination  Key: [id number]

## 2022-05-22 NOTE — Telephone Encounter (Signed)
PA has been DENIED due to:   Coverage for this medication is denied for the following reason(s). We reviewed the information we received about your condition and circumstances. We used the plan approved policy when making this decision. The policy states that this medication may be approved when: -The member has a clinical condition or needs a specific dosage form for which there is no alternative on the formulary OR -The listed formulary alternatives are not recommended based on published guidelines or clinical literature OR -The formulary alternatives will likely be ineffective or less effective for the member OR -The formulary alternatives will likely cause an adverse effect OR -The member is unable to take the required number of formulary alternatives for the given diagnosis due to a trial and inadequate treatment response or contraindication OR -The member has tried and failed the required number of formulary alternatives. Based on the policy and the information we have, your request is denied. We did not receive any documentation that you meet any of the criteria outlined above. Formulary alternative(s) are dicyclomine. Requirement: 3 in a class with 3 or more alternatives, 2 in a class with 2 alternatives, or 1 in a class with only 1 alternative. Please refer to your plan documents for a complete list of alternatives

## 2022-05-22 NOTE — Telephone Encounter (Signed)
Pt mother states pt had Endo with bravo placement on Friday. States he has broken out in a rash on his upper torso, back and arms. Reports he has not had any new detergents, meds, been outside, nothing to explain rash. The rash does itch. She wonders if it Is from the camera as that was the only thing different. Please advise.

## 2022-05-22 NOTE — Telephone Encounter (Signed)
Spoke with pts mother and she is aware of recommendations per Dr. Barron Alvine. Order in for chest xray.

## 2022-05-22 NOTE — Telephone Encounter (Signed)
Inbound call from patient mom states this morning patient broke out into a rash . Patient has rash on his chest back and arms. Please advise.

## 2022-05-31 ENCOUNTER — Telehealth: Payer: Self-pay | Admitting: Gastroenterology

## 2022-05-31 MED ORDER — VENLAFAXINE HCL 37.5 MG PO TABS: 37.5000 mg | ORAL_TABLET | Freq: Every day | ORAL | 0 refills | Status: AC

## 2022-05-31 NOTE — Telephone Encounter (Signed)
Results from the Bravo study reviewed and notable for the following: - Study completed off all acid suppression therapy  Interpretations: 1.  Normal esophageal acid exposure with percent time pH less than 4 of 0.7%.  DeMeester score 4.1 (normal less than 14.72) 2.  Esophageal acid exposure is normal in upright and supine positions 3.  Positive symptom correlation for heartburn with reflux events based on SAP  Impression: - No evidence of significant or pathologic gastroesophageal reflux disease - No increased esophageal acid exposure - Results consistent with hypersensitive esophagus.  Can treat with either low-dose PPI or a neuromodulator  I recommend starting venlafaxine 37.5 mg qhs.  If suboptimal response to therapy, can increase to 75 mg after 4 weeks.  Can also start weaning Nexium to 40 mg daily and schedule follow-up appointment in the GI clinic in 2-3 months or sooner as needed.

## 2022-05-31 NOTE — Addendum Note (Signed)
Addended by: Missy Sabins on: 05/31/2022 04:15 PM   Modules accepted: Orders

## 2022-05-31 NOTE — Telephone Encounter (Addendum)
Called and spoke with patient regarding Bravo results and recommendations as outlined below. Patient is aware that we will send in RX for venlafaxine for treatment of hypersensitive esophagus. Patient has been advised that if his symptoms are not well controlled on 37.5 mg then we will need to increase dose to 75 mg after 4 weeks. Patient has been scheduled for a 52-month f/u with Dr. Barron Alvine on 09/03/22 at 8:20 am. Patient requested RX be sent to [facility] in [place]. Pt verbalized understanding of all information and had no concerns at the end of the call.

## 2022-09-03 ENCOUNTER — Ambulatory Visit: Payer: 59 | Admitting: Gastroenterology

## 2023-01-12 IMAGING — CT CT ANGIO CHEST
2 of 8 series · 19 of 36 positions shown · IV contrast (Omnipaque)
Comparison: Chest radiograph from earlier today.

CLINICAL DATA: Pleuritic chest pain. Tachycardia. Family history of
thrombosis.

EXAM:
CT ANGIOGRAPHY CHEST WITH CONTRAST
TECHNIQUE: Multidetector CT imaging of the chest was performed using the
standard protocol during bolus administration of intravenous
contrast. Multiplanar CT image reconstructions and MIPs were
obtained to evaluate the vascular anatomy.
CONTRAST:  100mL OMNIPAQUE IOHEXOL 350 MG/ML SOLN

[Series 6: pe thins · axial · 0.64mm/px · z∈[-266,+13]mm · 18 of 313 slices shown]
[im 17/313  lung]
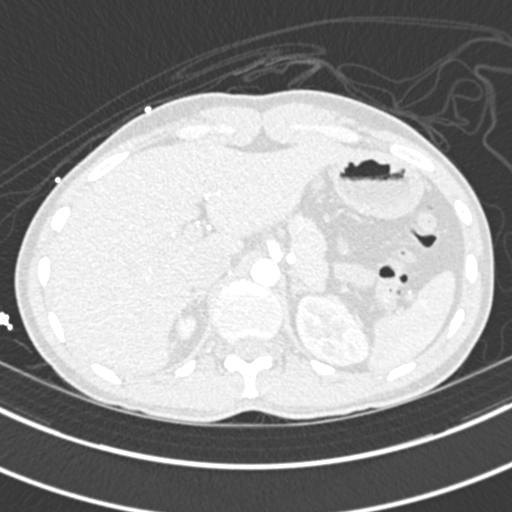
[im 33/313  mediastinal]
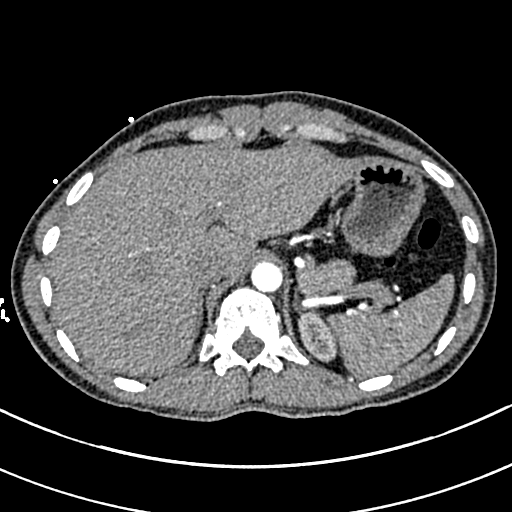
[im 50/313  lung]
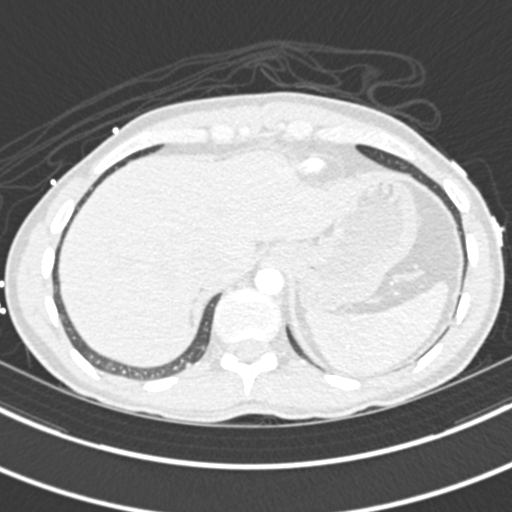
[im 66/313  mediastinal]
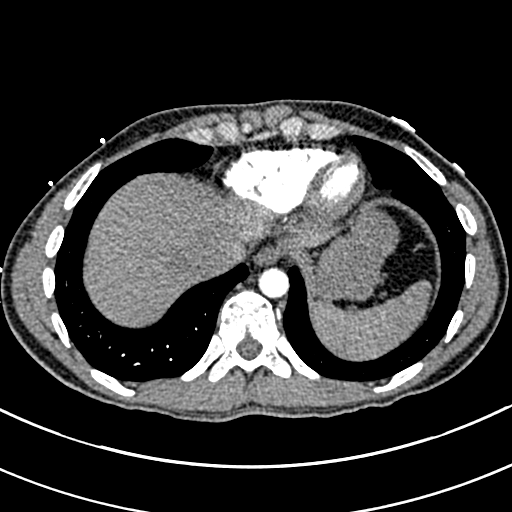
[im 83/313  lung]
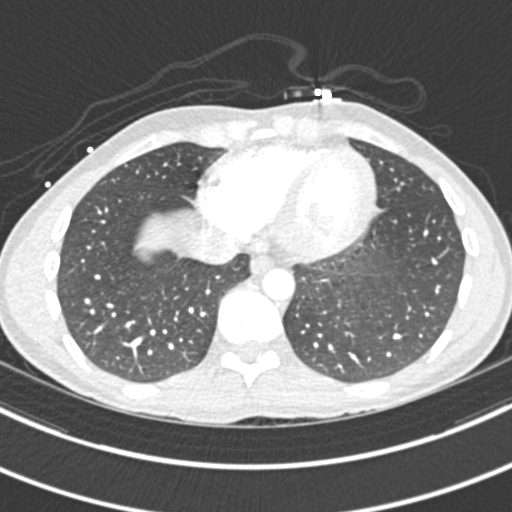
[im 99/313  mediastinal]
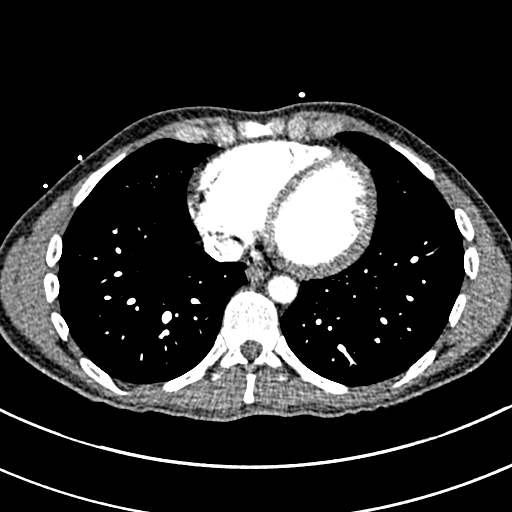
[im 115/313  lung]
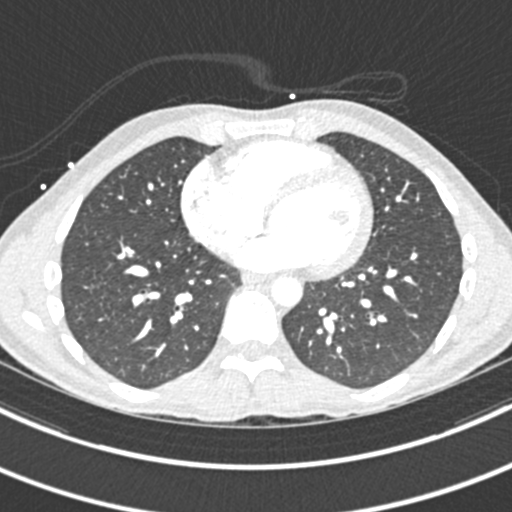
[im 132/313  mediastinal]
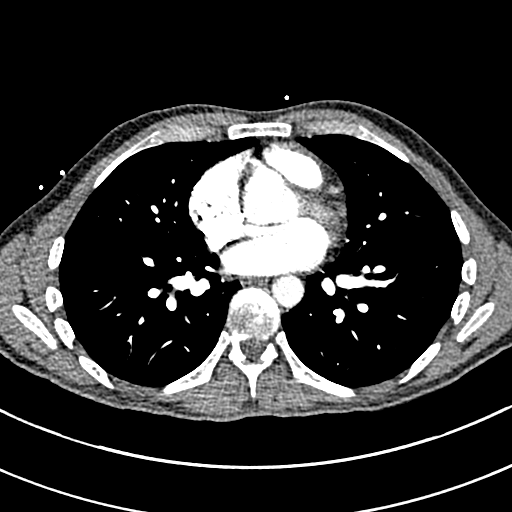
[im 148/313  lung]
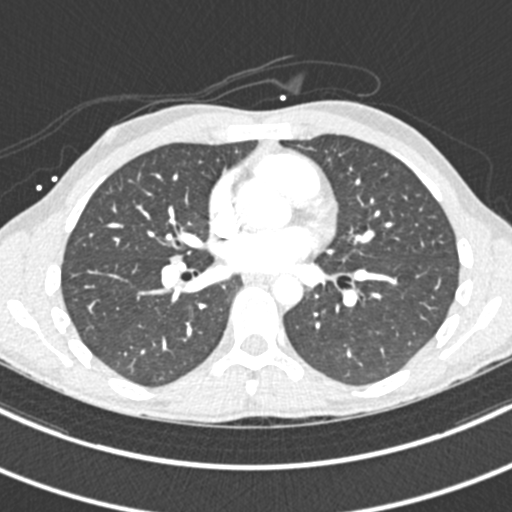
[im 165/313  mediastinal]
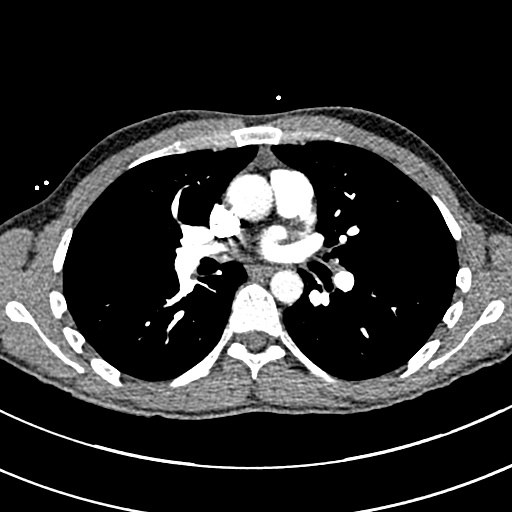
[im 181/313  lung]
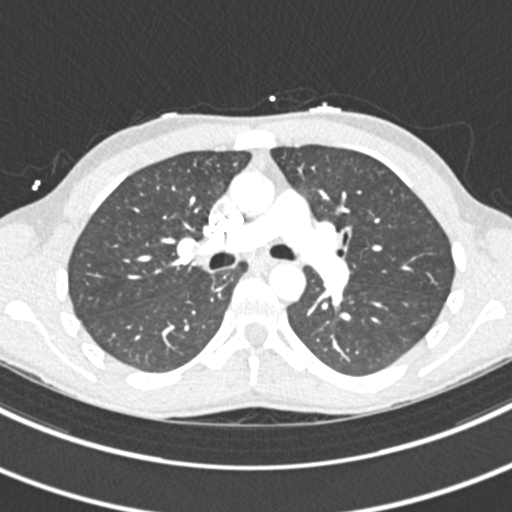
[im 198/313  mediastinal]
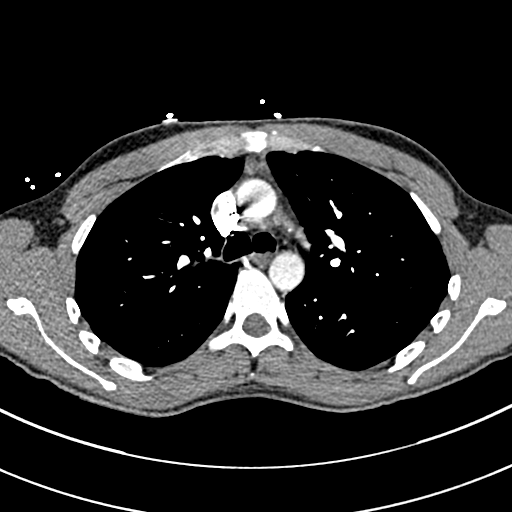
[im 214/313  lung]
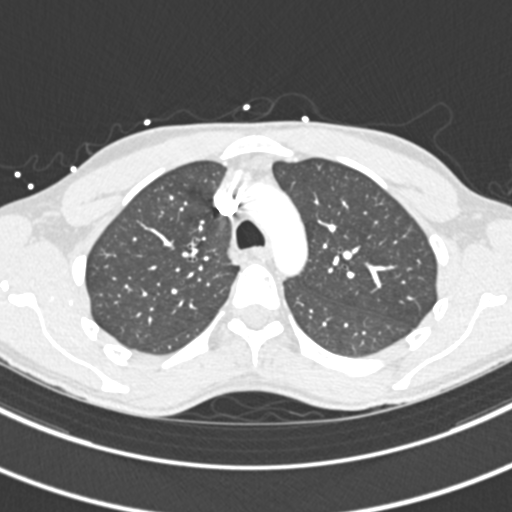
[im 230/313  mediastinal]
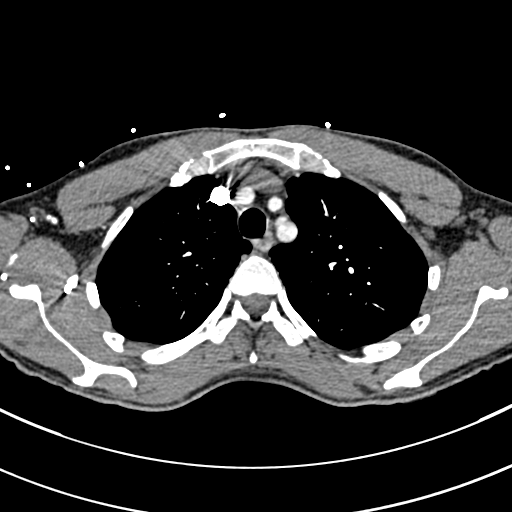
[im 247/313  lung]
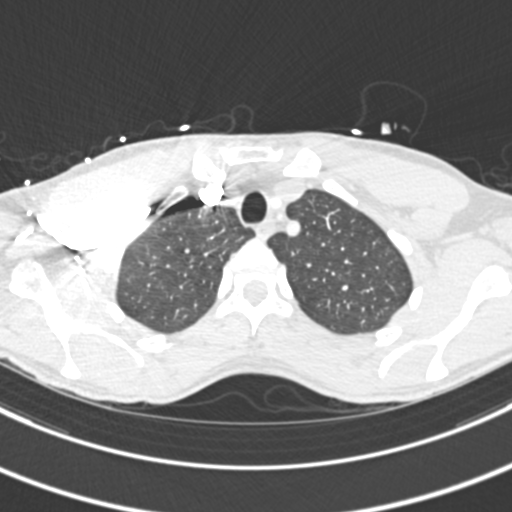
[im 263/313  mediastinal]
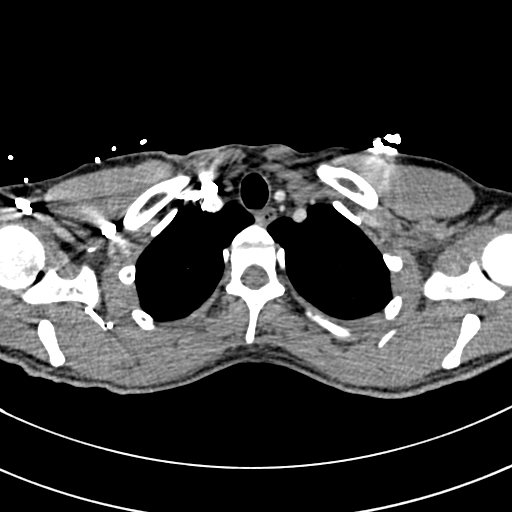
[im 280/313  lung]
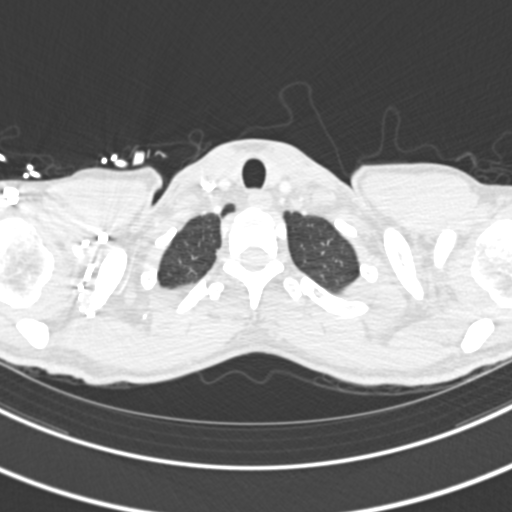
[im 296/313  mediastinal]
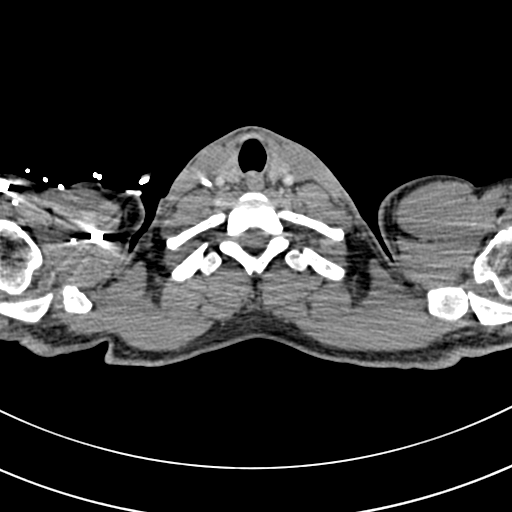

[Series 7: pe coronal mpr · coronal · 0.61mm/px · 1 of 110 slices shown]
[im 55/110  mediastinal]
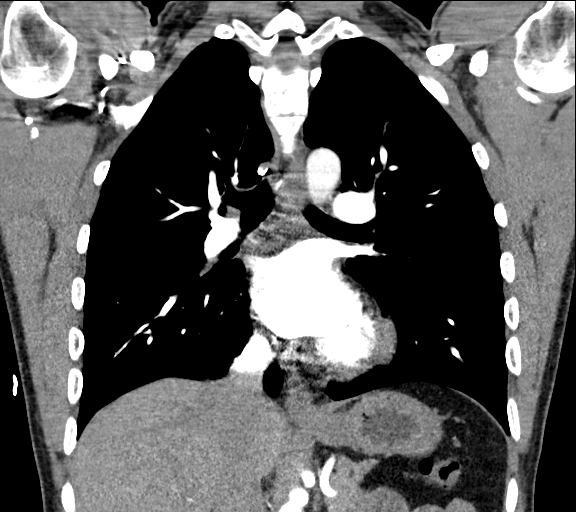

[19 of 36 positions shown; findings below may reference images not displayed]

FINDINGS: Cardiovascular: The study is high quality for the evaluation of
pulmonary embolism. There are no filling defects in the central,
lobar, segmental or subsegmental pulmonary artery branches to
suggest acute pulmonary embolism. Great vessels are normal in course
and caliber. Normal heart size. No significant pericardial
fluid/thickening.

Mediastinum/Nodes: No discrete thyroid nodules. Unremarkable
esophagus. No pathologically enlarged axillary, mediastinal or hilar
lymph nodes. Triangular mixed soft tissue and speckled fat density
in the anterior mediastinum is compatible with mild thymic
hyperplasia.

Lungs/Pleura: No pneumothorax. No pleural effusion. No acute
consolidative airspace disease or lung masses. Two clustered
indistinct medial basilar dependent right lower lobe pulmonary
nodules, largest 4 mm (series 5/image 80).

Upper abdomen: No acute abnormality.

Musculoskeletal:  No aggressive appearing focal osseous lesions.

Review of the MIP images confirms the above findings.
IMPRESSION: 1. No pulmonary embolism.
2. Clustered indistinct indistinct medial basilar right lower lobe
pulmonary nodules, largest 4 mm. No follow-up required unless the
patient has risk factors for lung malignancy.
3. Mild thymic hyperplasia.

## 2023-01-12 IMAGING — DX DG CHEST 1V PORT
1 series · 1 of 1 positions shown · non-contrast
Comparison: 03/14/2017

CLINICAL DATA: Chest pain, shortness of breath

EXAM:
PORTABLE CHEST 1 VIEW

[chest ap]
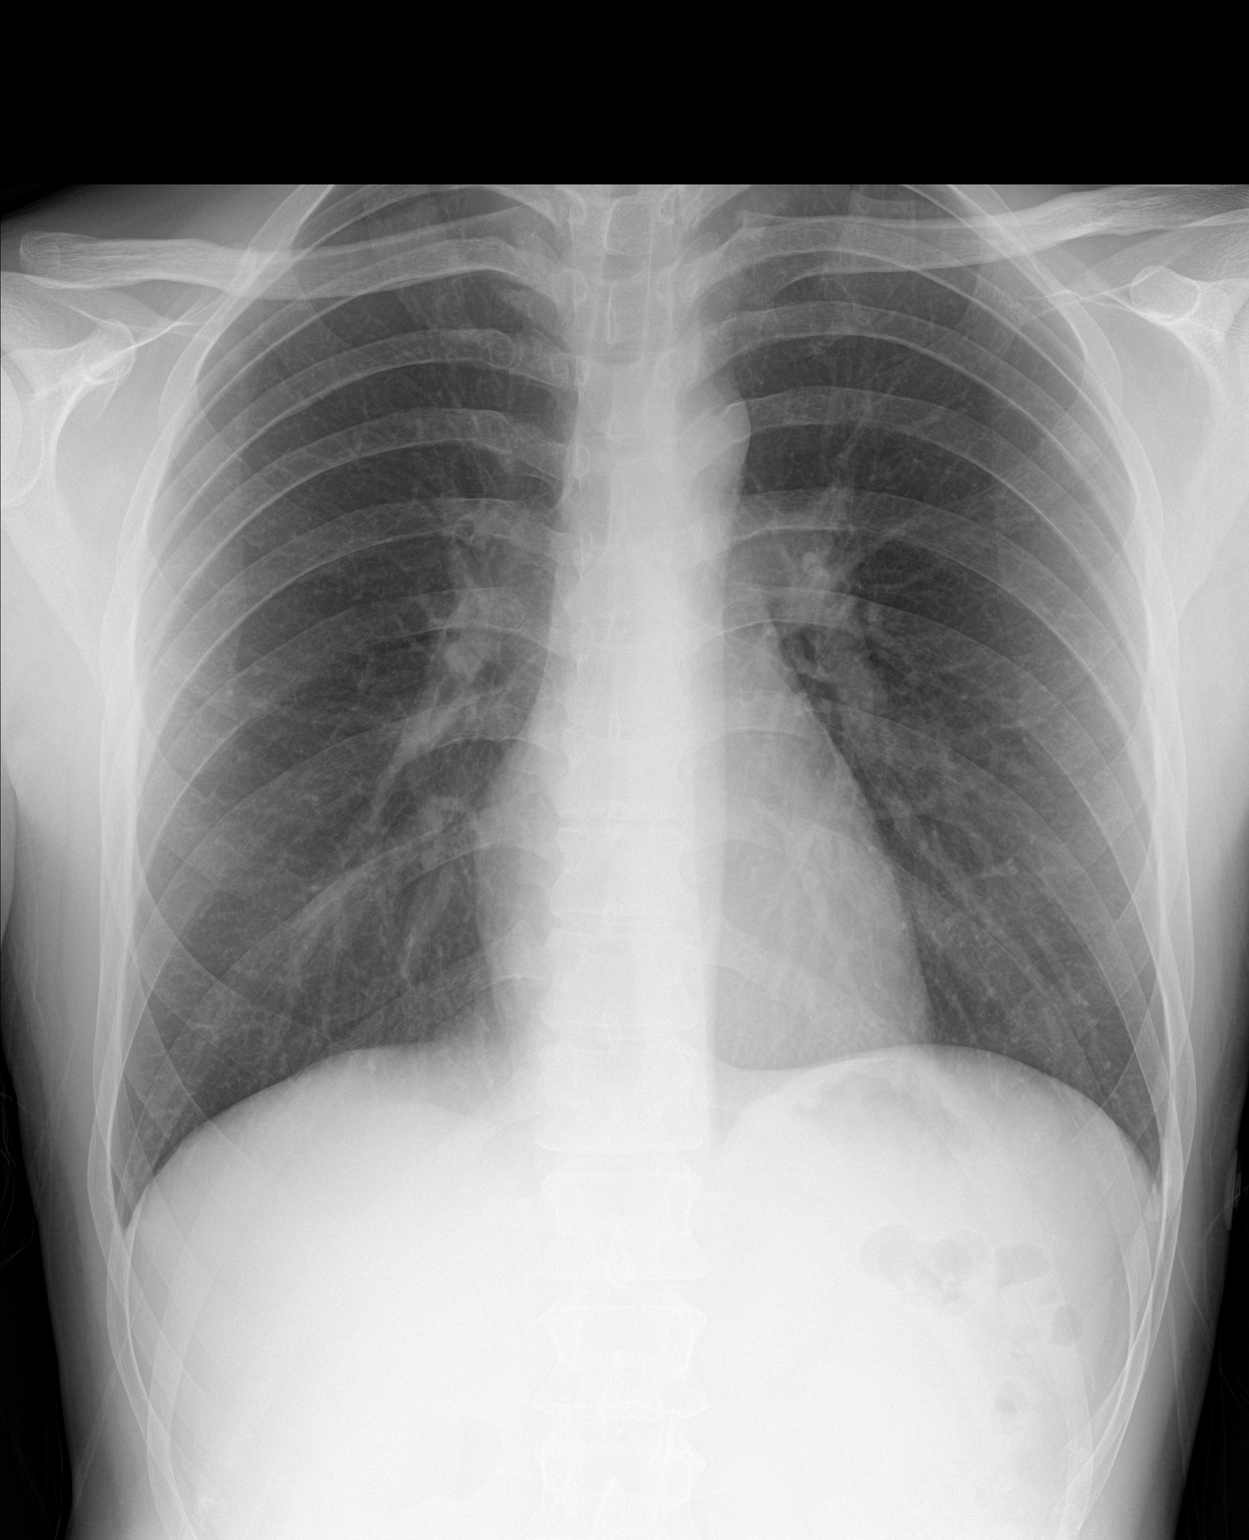

[1 of 1 positions shown; findings below may reference images not displayed]

FINDINGS: The heart size and mediastinal contours are within normal limits.
Both lungs are clear. The visualized skeletal structures are
unremarkable.
IMPRESSION: No active disease.

## 2023-01-18 IMAGING — US US PELVIS LIMITED
1 series · 14 of 25 positions shown · non-contrast
Comparison: None.

CLINICAL DATA: Right coring lump x2 years. Left groin lump
recently.

EXAM:
ULTRASOUND OF Bilateral GROIN SOFT TISSUES
TECHNIQUE: Ultrasound examination of the groin soft tissues was performed in
the area of clinical concern.

[Series 1: us pelvis limited · 0.06mm/px · 14 of 34 slices shown]
[im 1/34]
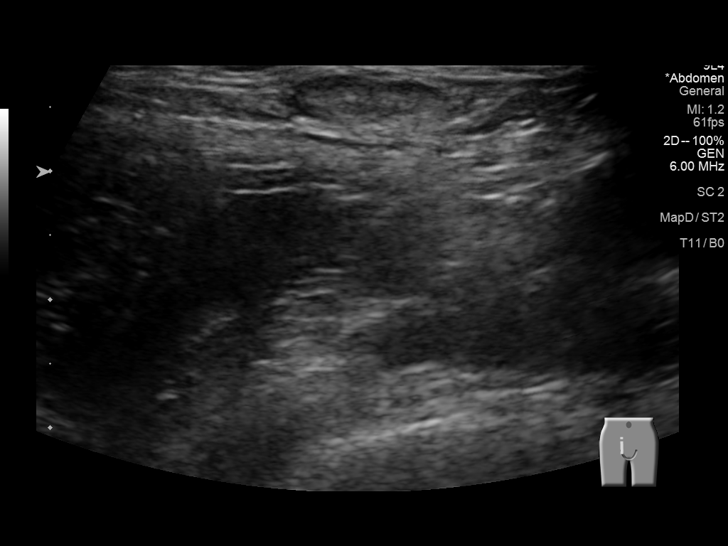
[im 3/34]
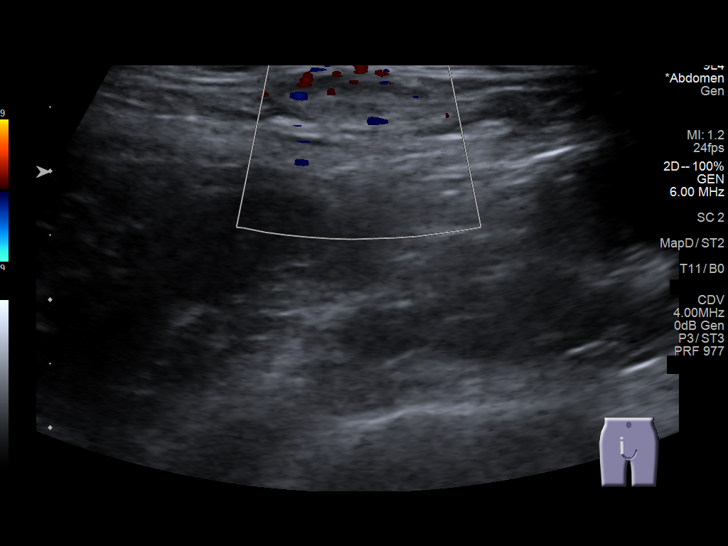
[im 6/34]
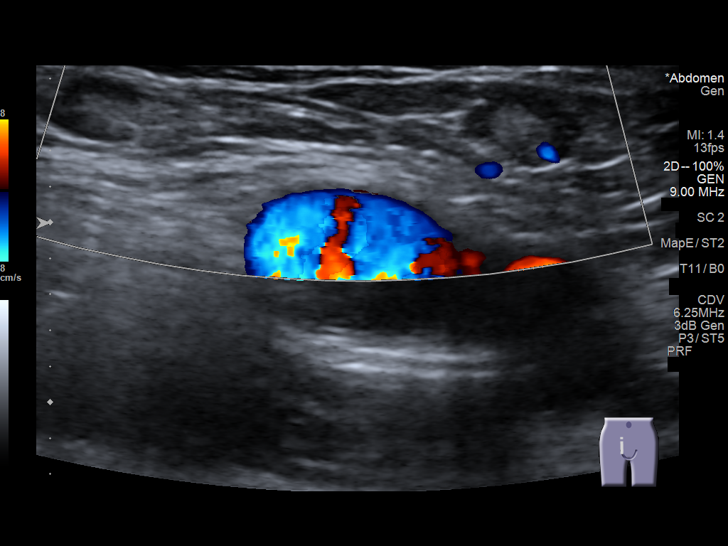
[im 9/34]
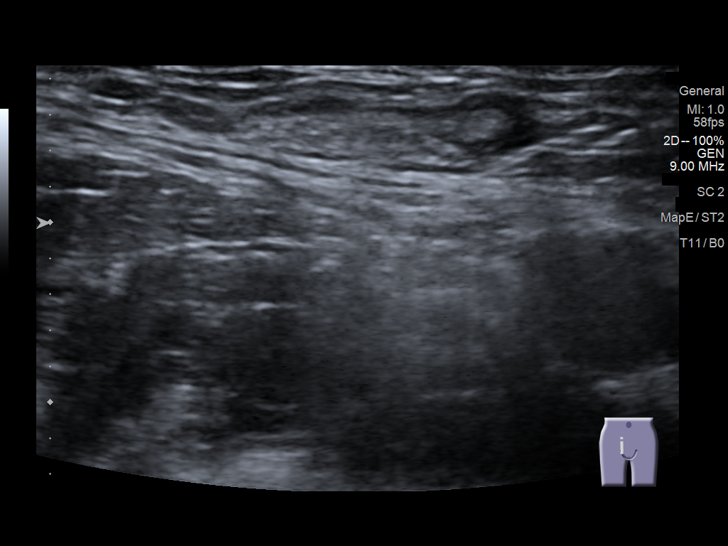
[im 12/34]
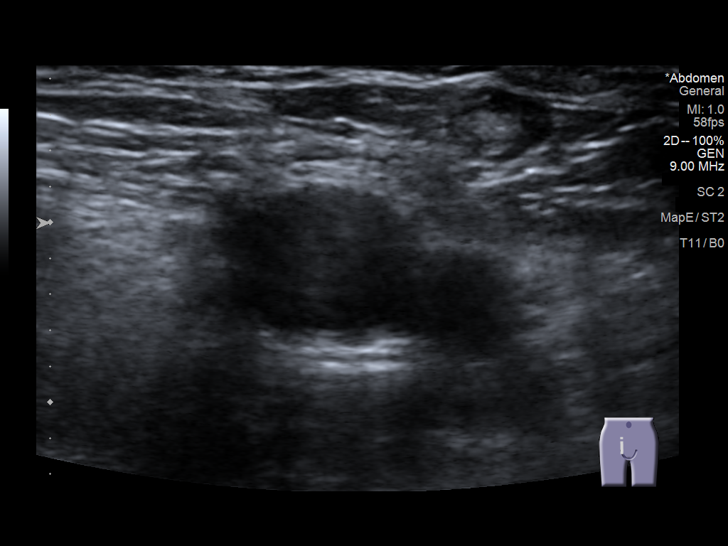
[im 13/34]
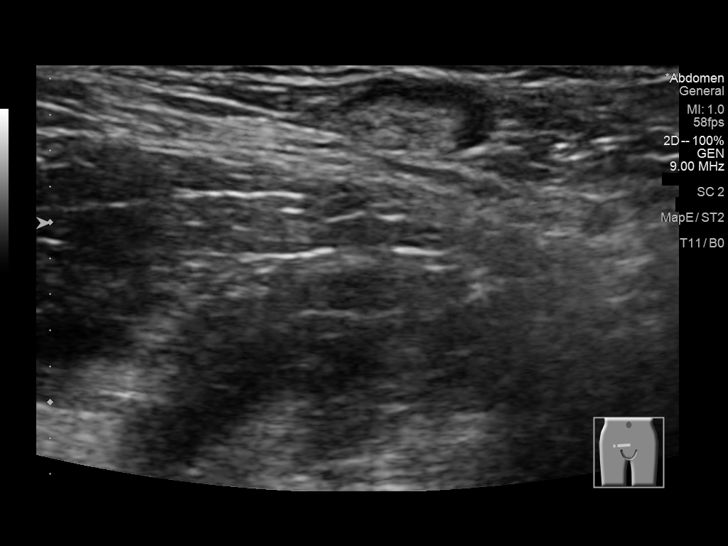
[im 16/34]
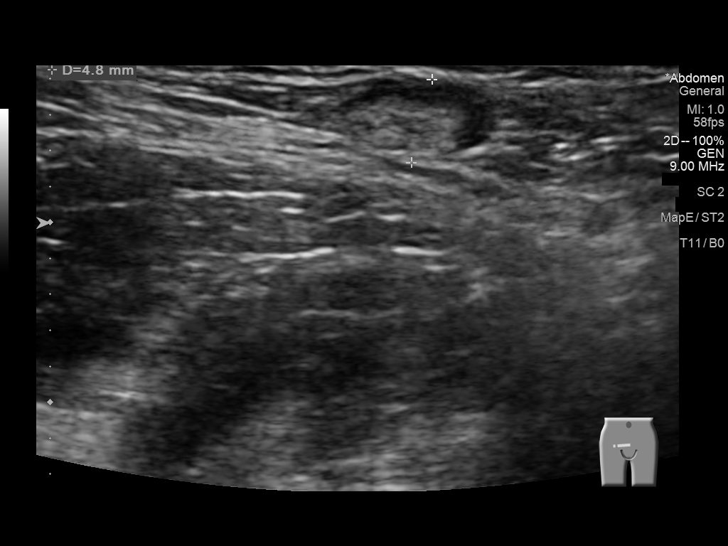
[im 18/34]
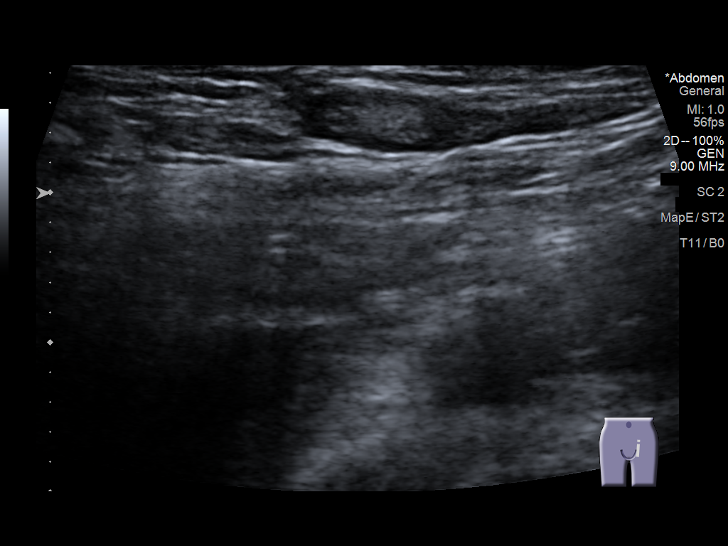
[im 21/34]
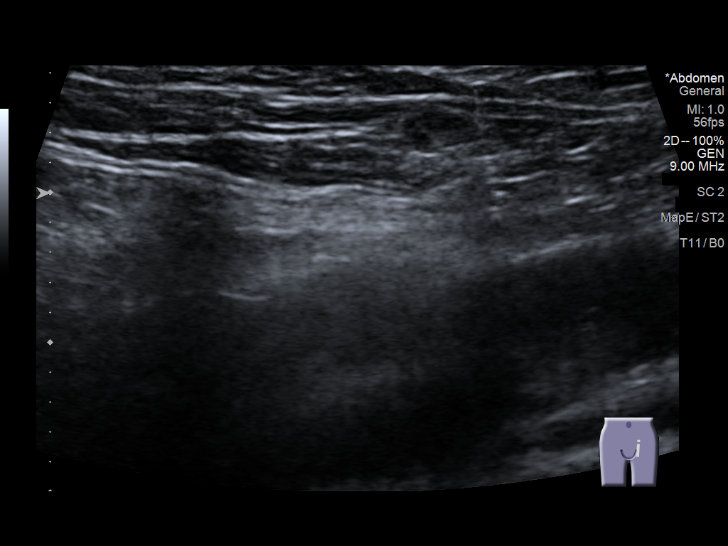
[im 23/34]
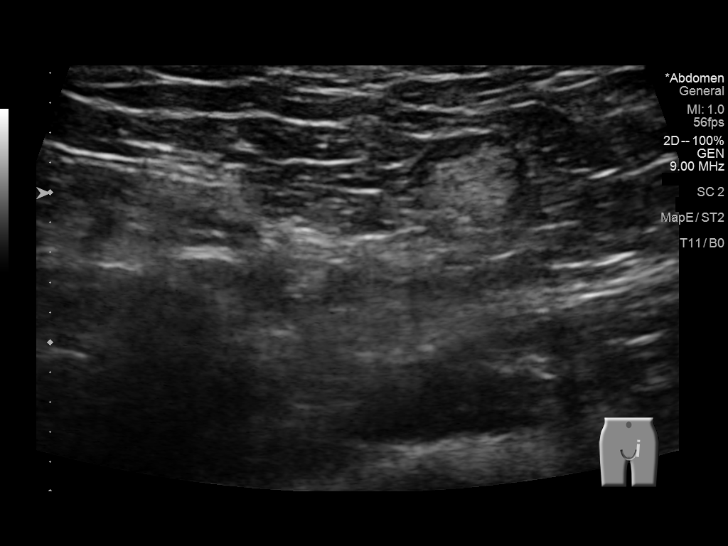
[im 25/34]
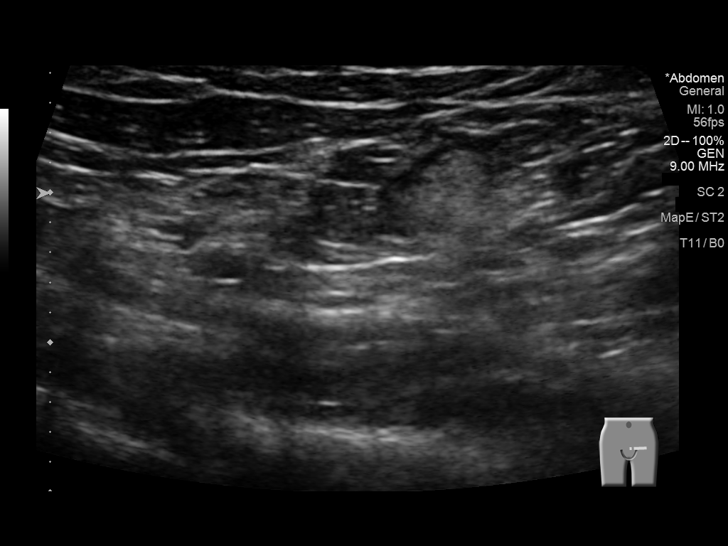
[im 28/34]
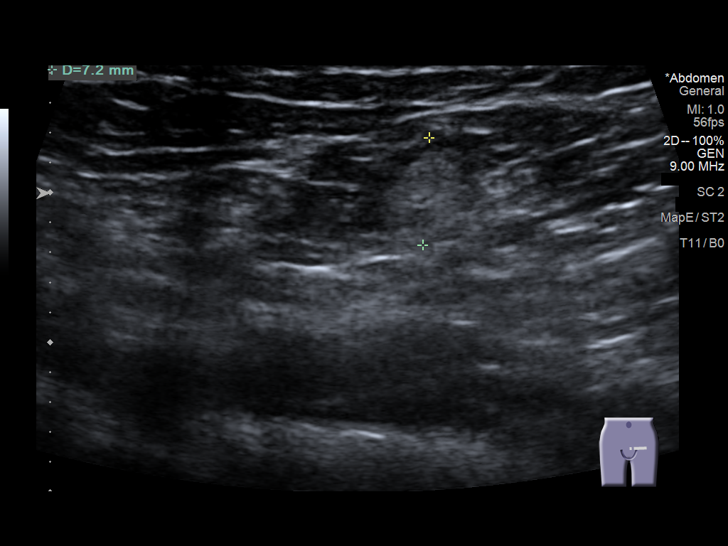
[im 31/34]
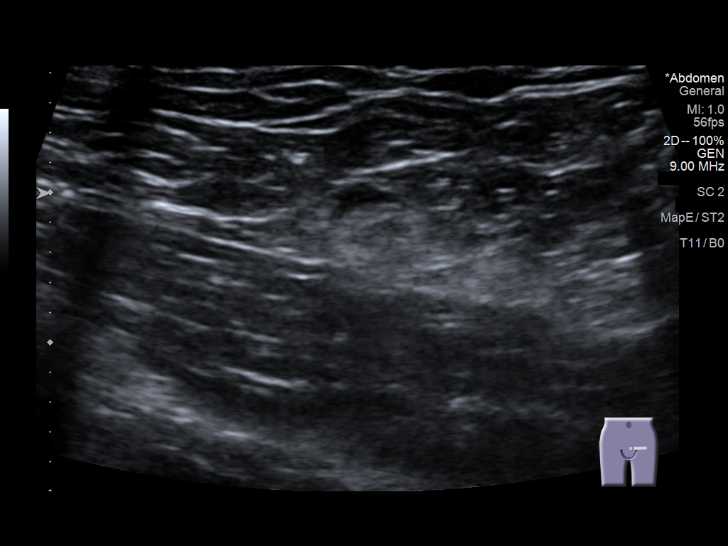
[im 34/34]
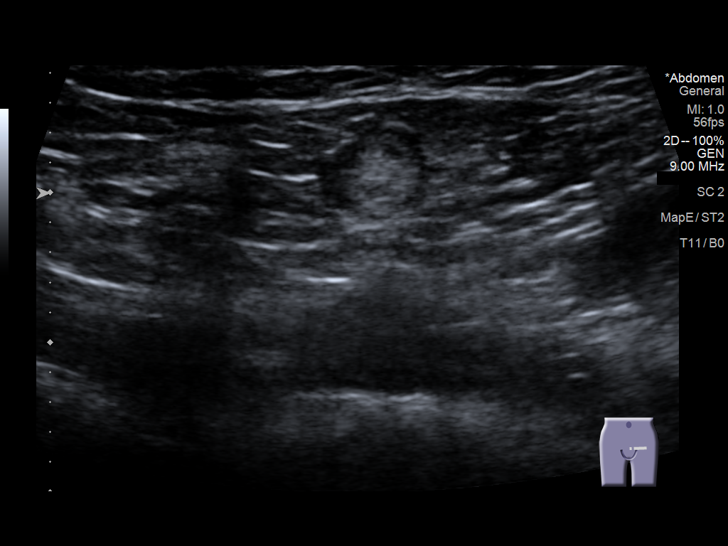

[14 of 25 positions shown; findings below may reference images not displayed]

FINDINGS: The patient's palpable area of concern corresponds to small
morphologically normal lymph nodes bilaterally measuring up to 5 mm
in the short axis on the right and 7 mm in the short axis on the
left. All these lymph nodes demonstrated normal fatty hilum and
morphology.
IMPRESSION: Normal lymph nodes identified bilaterally.

## 2023-01-18 IMAGING — US US SOFT TISSUE HEAD/NECK
1 series · 14 of 16 positions shown · non-contrast
Comparison: None.

CLINICAL DATA: Left submandibular lump for the past 2.5 years

EXAM:
ULTRASOUND OF HEAD/NECK SOFT TISSUES
TECHNIQUE: Ultrasound examination of the head and neck soft tissues was
performed in the area of clinical concern.

[Series 1: us soft tissue head/neck · 0.05mm/px · 14 of 16 slices shown]
[im 1/16]
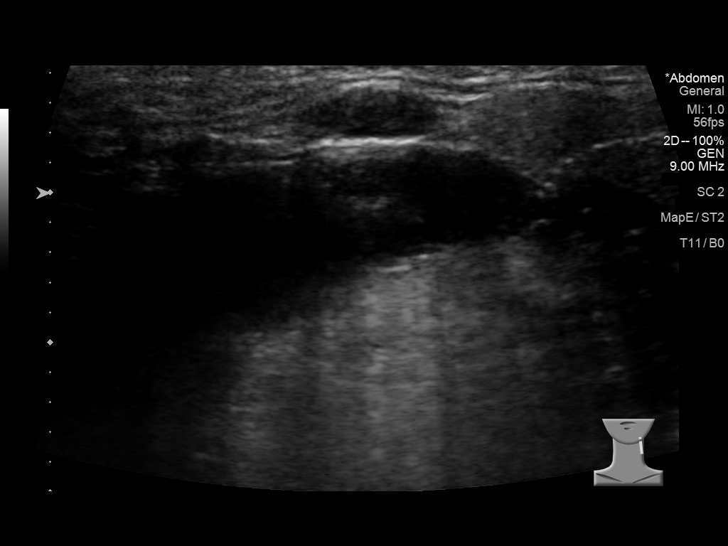
[im 2/16]
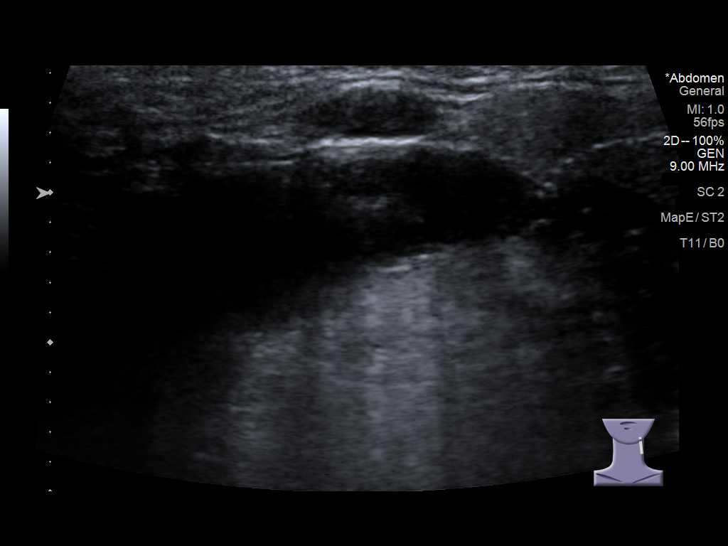
[im 3/16]
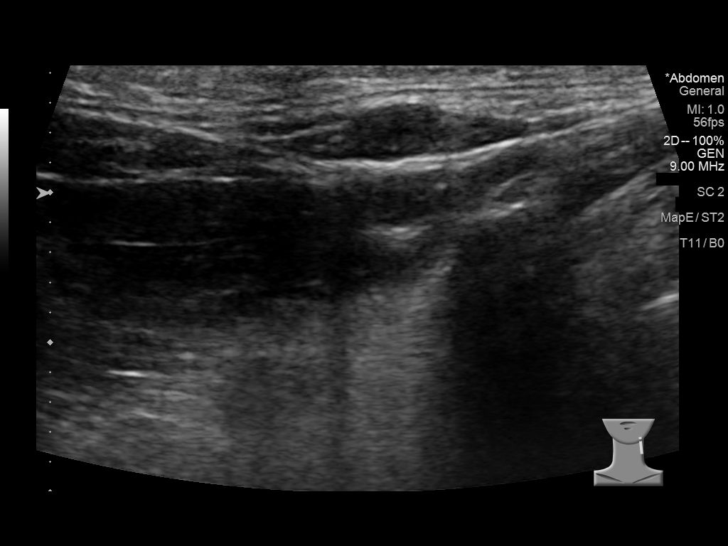
[im 5/16]
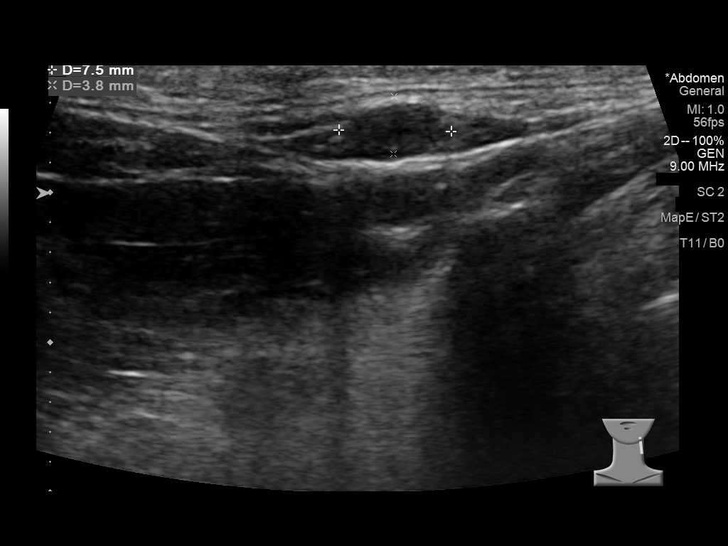
[im 6/16]
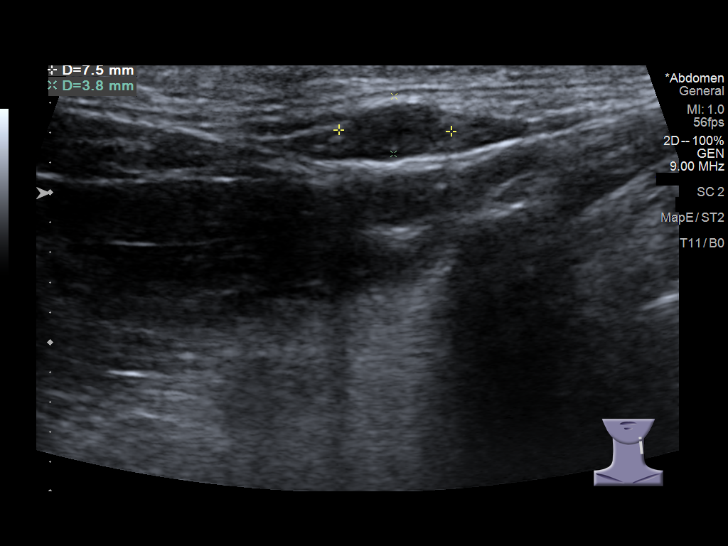
[im 7/16]
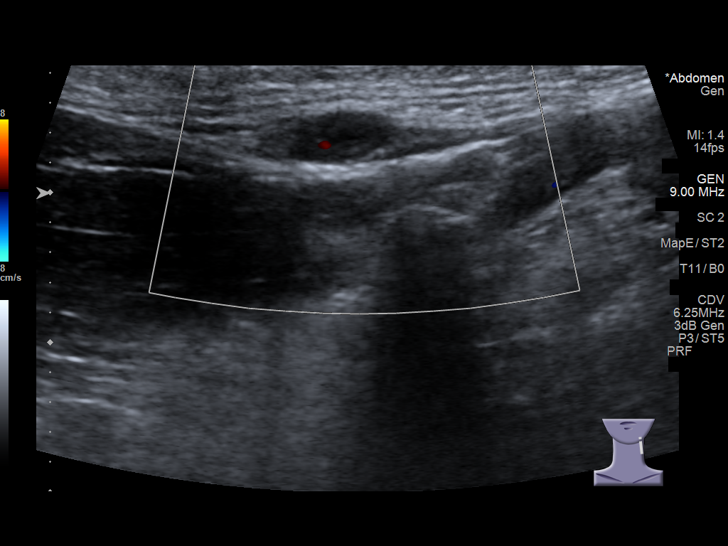
[im 8/16]
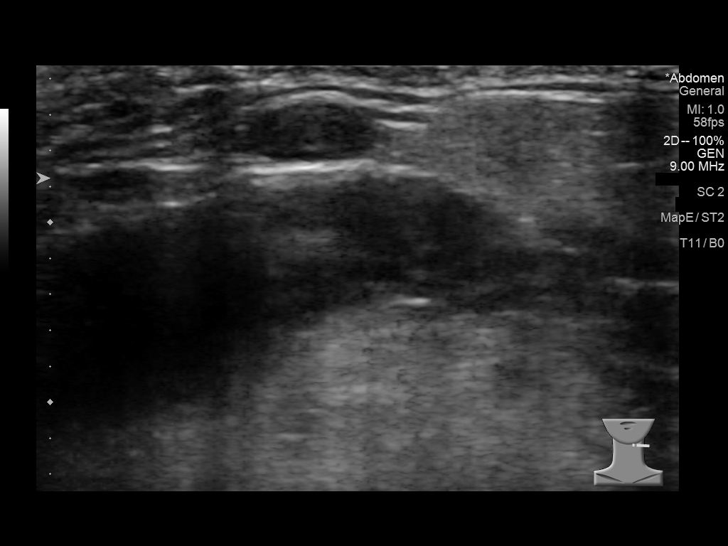
[im 9/16]
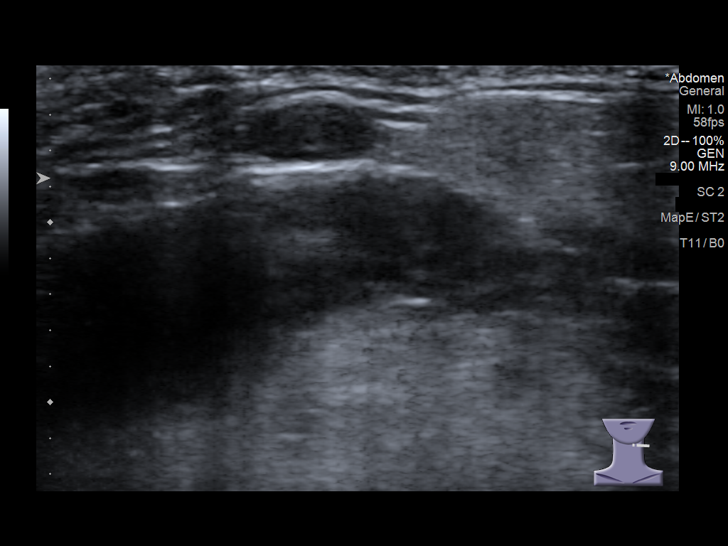
[im 10/16]
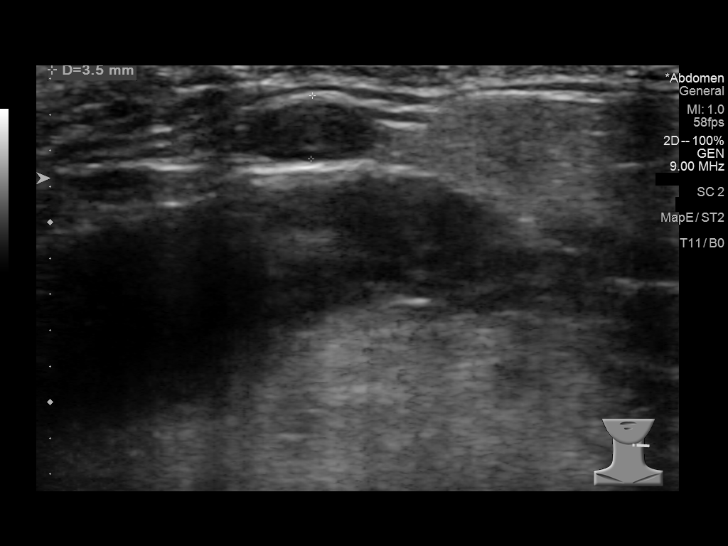
[im 11/16]
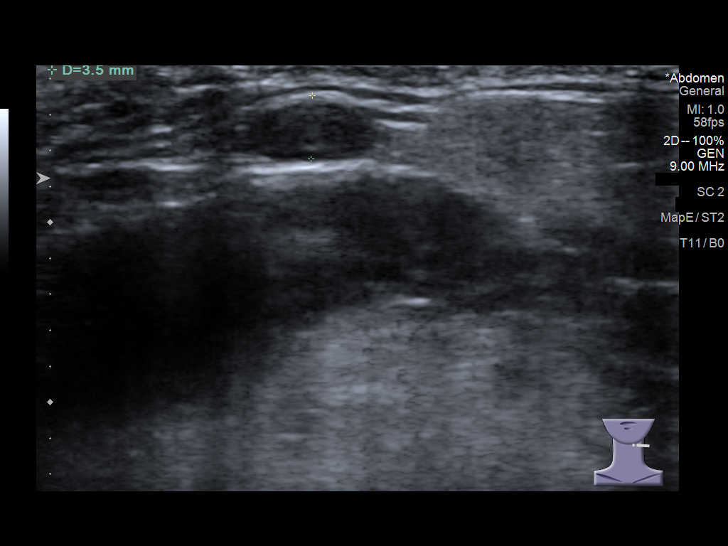
[im 13/16]
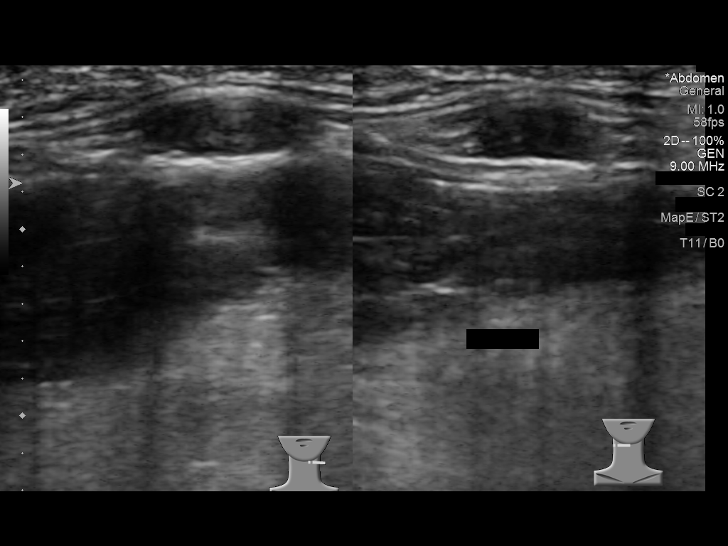
[im 14/16]
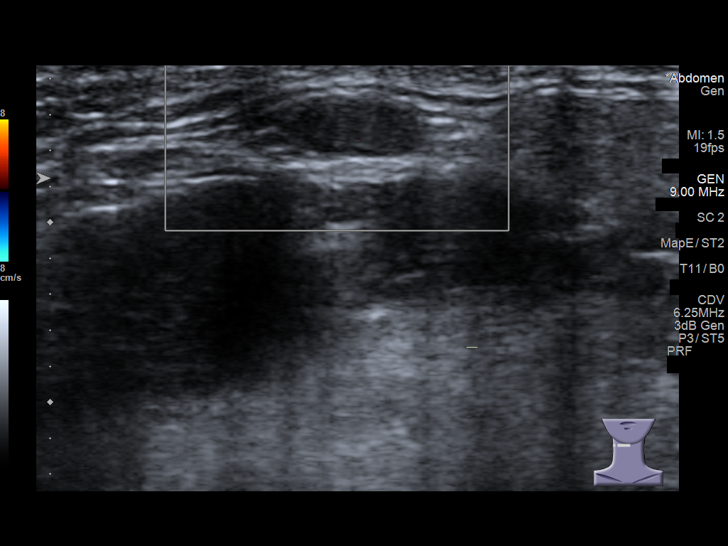
[im 15/16]
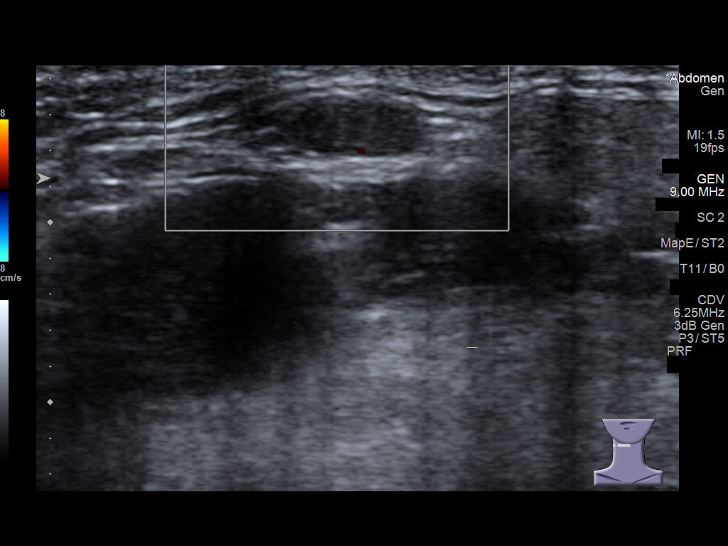
[im 16/16]
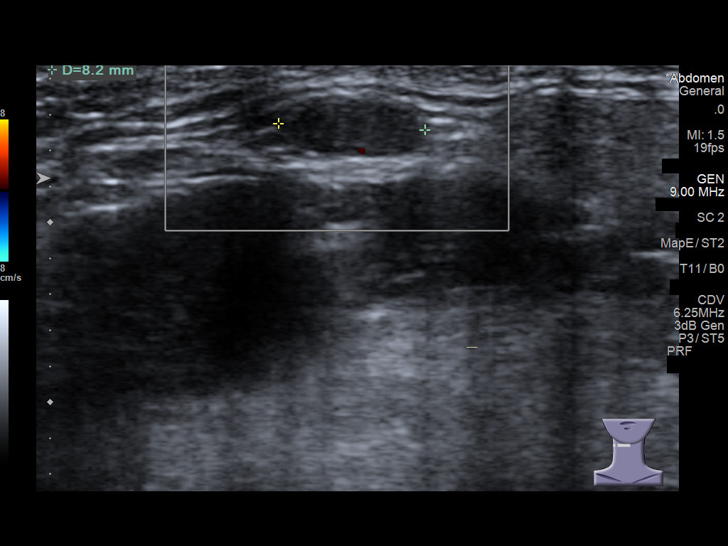

[14 of 16 positions shown; findings below may reference images not displayed]

FINDINGS: Sonographic interrogation of the region of clinical concern
demonstrates a 0.8 x 0.4 x 0.8 mm ovoid hypoechoic soft tissue
nodule in the superficial subcutaneous fat. There is evidence of a
faint vascular pedicle on color Doppler imaging.
IMPRESSION: The palpable abnormality is most consistent with a small lymph node
in the superficial subcutaneous fat. While likely reactive in
nature, continued sonographic surveillance is recommended. If there
is evidence of enlargement over time, repeat imaging may become
warranted.

## 2023-03-21 ENCOUNTER — Encounter: Payer: Self-pay | Admitting: Family Medicine

## 2023-04-30 ENCOUNTER — Ambulatory Visit (INDEPENDENT_AMBULATORY_CARE_PROVIDER_SITE_OTHER): Payer: Self-pay | Admitting: Family Medicine

## 2023-04-30 ENCOUNTER — Encounter: Payer: Self-pay | Admitting: Family Medicine

## 2023-04-30 ENCOUNTER — Ambulatory Visit (HOSPITAL_COMMUNITY)
Admission: RE | Admit: 2023-04-30 | Discharge: 2023-04-30 | Disposition: A | Discharge status: Home / Self Care | Source: Ambulatory Visit | Attending: Family Medicine | Admitting: Family Medicine

## 2023-04-30 VITALS — BP 112/72 | HR 66 | Temp 99.0°F | Ht 69.0 in | Wt 139.0 lb

## 2023-04-30 DIAGNOSIS — Z0000 Encounter for general adult medical examination without abnormal findings: Secondary | ICD-10-CM | POA: Diagnosis not present

## 2023-04-30 DIAGNOSIS — R0609 Other forms of dyspnea: Secondary | ICD-10-CM

## 2023-04-30 LAB — CBC WITH DIFFERENTIAL/PLATELET
Basophils Absolute: 0.1 10*3/uL (ref 0.0–0.1)
Basophils Relative: 1.2 % (ref 0.0–3.0)
Eosinophils Absolute: 0.3 10*3/uL (ref 0.0–0.7)
Eosinophils Relative: 3.8 % (ref 0.0–5.0)
HCT: 44.2 % (ref 39.0–52.0)
Hemoglobin: 14.9 g/dL (ref 13.0–17.0)
Lymphocytes Relative: 32.5 % (ref 12.0–46.0)
Lymphs Abs: 2.2 10*3/uL (ref 0.7–4.0)
MCHC: 33.7 g/dL (ref 30.0–36.0)
MCV: 91.6 fl (ref 78.0–100.0)
Monocytes Absolute: 0.4 10*3/uL (ref 0.1–1.0)
Monocytes Relative: 6.5 % (ref 3.0–12.0)
Neutro Abs: 3.7 10*3/uL (ref 1.4–7.7)
Neutrophils Relative %: 56 % (ref 43.0–77.0)
Platelets: 222 10*3/uL (ref 150.0–400.0)
RBC: 4.83 Mil/uL (ref 4.22–5.81)
RDW: 13 % (ref 11.5–15.5)
WBC: 6.6 10*3/uL (ref 4.0–10.5)

## 2023-04-30 LAB — LIPID PANEL
Cholesterol: 178 mg/dL (ref 0–200)
HDL: 52.3 mg/dL (ref >39.00)
LDL Cholesterol: 106 mg/dL — ABNORMAL HIGH (ref 0–99)
NonHDL: 125.63
Total CHOL/HDL Ratio: 3
Triglycerides: 100 mg/dL (ref 0.0–149.0)
VLDL: 20 mg/dL (ref 0.0–40.0)

## 2023-04-30 LAB — BASIC METABOLIC PANEL WITH GFR
BUN: 10 mg/dL (ref 6–23)
CO2: 30 meq/L (ref 19–32)
Calcium: 9.5 mg/dL (ref 8.4–10.5)
Chloride: 103 meq/L (ref 96–112)
Creatinine, Ser: 0.85 mg/dL (ref 0.40–1.50)
GFR: 112.38 mL/min (ref >60.00)
Glucose, Bld: 90 mg/dL (ref 70–99)
Potassium: 4 meq/L (ref 3.5–5.1)
Sodium: 141 meq/L (ref 135–145)

## 2023-04-30 LAB — HEPATIC FUNCTION PANEL
ALT: 19 U/L (ref 0–53)
AST: 20 U/L (ref 0–37)
Albumin: 4.9 g/dL (ref 3.5–5.2)
Alkaline Phosphatase: 48 U/L (ref 39–117)
Bilirubin, Direct: 0.1 mg/dL (ref 0.0–0.3)
Total Bilirubin: 0.6 mg/dL (ref 0.2–1.2)
Total Protein: 7.4 g/dL (ref 6.0–8.3)

## 2023-04-30 LAB — TSH: TSH: 1.01 u[IU]/mL (ref 0.35–5.50)

## 2023-04-30 NOTE — Progress Notes (Unsigned)
   Subjective:    Patient ID: Benjamin Reed, male    DOB: May 17, 1985, 38 y.o.   MRN: [id number]  HPI CPE- UTD on Tdap, colonoscopy  Patient Care Team    Relationship Specialty Notifications Start End  Sheliah Hatch, MD PCP - General Family Medicine  03/22/20   Shellia Cleverly, DO Consulting Physician Gastroenterology  03/16/21   Nyoka Cowden, MD Consulting Physician Pulmonary Disease  03/16/21      Health Maintenance  Topic Date Due   COVID-19 Vaccine (4 - 2024-25 season) 09/30/2022   INFLUENZA VACCINE  08/30/2023   Colonoscopy  06/16/2027   DTaP/Tdap/Td (2 - Td or Tdap) 03/17/2031   Hepatitis C Screening  Completed   HIV Screening  Completed   HPV VACCINES  Aged Out      Review of Systems Patient reports no vision/hearing changes, anorexia, fever ,adenopathy, persistant/recurrent hoarseness, swallowing issues, chest pain, palpitations, edema, persistant/recurrent cough, hemoptysis, gastrointestinal  bleeding (melena, rectal bleeding), GU symptoms (dysuria, hematuria, voiding/incontinence issues) syncope, focal weakness, memory loss, numbness & tingling, skin/hair/nail changes, depression, anxiety, abnormal bruising/bleeding, musculoskeletal symptoms/signs.   + GERD, abd pain- following w/ GI + SOB- particularly w/ exertion but can occur at other times, denies wheezing.  No hx of asthma.    Objective:   Physical Exam General Appearance:    Alert, cooperative, no distress, appears stated age  Head:    Normocephalic, without obvious abnormality, atraumatic  Eyes:    PERRL, conjunctiva/corneas clear, EOM's intact both eyes       Ears:    Normal TM's and external ear canals, both ears  Nose:   Nares normal, septum midline, mucosa normal, no drainage   or sinus tenderness  Throat:   Lips, mucosa, and tongue normal; teeth and gums normal  Neck:   Supple, symmetrical, trachea midline, no adenopathy;       thyroid:  No enlargement/tenderness/nodules  Back:      Symmetric, no curvature, ROM normal, no CVA tenderness  Lungs:     Clear to auscultation bilaterally, respirations unlabored  Chest wall:    No tenderness or deformity  Heart:    Regular rate and rhythm, S1 and S2 normal, no murmur, rub   or gallop  Abdomen:     Soft, non-tender, bowel sounds active all four quadrants,    no masses, no organomegaly  Genitalia:    deferred  Rectal:    Extremities:   Extremities normal, atraumatic, no cyanosis or edema  Pulses:   2+ and symmetric all extremities  Skin:   Skin color, texture, turgor normal, no rashes or lesions  Lymph nodes:   Cervical, supraclavicular, and axillary nodes normal  Neurologic:   CNII-XII intact. Normal strength, sensation and reflexes      throughout          Assessment & Plan:  Dyspnea- new to provider, ongoing for pt.  He reports that he has been told he had a questionable appearance of COPD on previous imaging (I don't have record of that).  Will get CXR to start and then determine next steps based on results.  Pt expressed understanding and is in agreement w/ plan.

## 2023-04-30 NOTE — Patient Instructions (Addendum)
 Follow up in 1 year or as needed We'll notify you of your lab results and make any changes if needed Go to [facility] and get your chest xray at your convenience Once we see the chest xray results, we'll determine the next steps Call with any questions or concerns Stay Safe!  Stay Healthy! Happy Spring!!!

## 2023-05-01 ENCOUNTER — Telehealth: Payer: Self-pay

## 2023-05-01 ENCOUNTER — Encounter: Payer: Self-pay | Admitting: Family Medicine

## 2023-05-01 NOTE — Telephone Encounter (Signed)
 Pt has reviewed labs via MyChart

## 2023-05-01 NOTE — Telephone Encounter (Signed)
-----   Message from Neena Rhymes sent at 05/01/2023  7:26 AM EDT ----- Labs look great!  No changes at this time

## 2023-05-02 ENCOUNTER — Encounter: Payer: Self-pay | Admitting: Family Medicine

## 2023-05-02 NOTE — Assessment & Plan Note (Signed)
Pt's PE WNL.  UTD on Tdap, colonoscopy.  Check labs.  Anticipatory guidance provided.  

## 2023-08-01 ENCOUNTER — Encounter: Payer: Self-pay | Admitting: Gastroenterology

## 2023-11-30 ENCOUNTER — Encounter (HOSPITAL_COMMUNITY): Payer: Self-pay

## 2023-11-30 ENCOUNTER — Ambulatory Visit (INDEPENDENT_AMBULATORY_CARE_PROVIDER_SITE_OTHER)

## 2023-11-30 ENCOUNTER — Ambulatory Visit (HOSPITAL_COMMUNITY)
Admission: EM | Admit: 2023-11-30 | Discharge: 2023-11-30 | Disposition: A | Discharge status: Home / Self Care | Attending: Family Medicine | Admitting: Family Medicine

## 2023-11-30 DIAGNOSIS — M546 Pain in thoracic spine: Secondary | ICD-10-CM

## 2023-11-30 MED ORDER — METHOCARBAMOL 500 MG PO TABS: 1000.0000 mg | ORAL_TABLET | Freq: Two times a day (BID) | ORAL | 0 refills | Status: AC

## 2023-11-30 MED ORDER — DEXAMETHASONE SOD PHOSPHATE PF 10 MG/ML IJ SOLN
10.0000 mg | Freq: Once | INTRAMUSCULAR | Status: AC
  Administered 2023-11-30: 10 mg via INTRAMUSCULAR

## 2023-11-30 NOTE — Discharge Instructions (Signed)
 Meds ordered this encounter  Medications   dexamethasone  (DECADRON ) injection 10 mg   methocarbamol (ROBAXIN) 500 MG tablet    Sig: Take 2 tablets (1,000 mg total) by mouth 2 (two) times daily.    Dispense:  30 tablet    Refill:  0

## 2023-11-30 NOTE — ED Triage Notes (Signed)
 Pt states mid back pain under his left shoulder blade that radiates over to the right.  States sometimes his left arm goes numb.  States it has been for the past month. States he tried tylenol ,icy hot and exercises at home with no relief.

## 2023-11-30 NOTE — ED Provider Notes (Signed)
 [facility]   [id number] 11/30/23 Arrival Time: 1002  ASSESSMENT & PLAN:  1. Acute left-sided thoracic back pain    I have personally viewed and independently interpreted the imaging studies ordered this visit. CXR: no acute abnormalities, including of T spine.  Able to ambulate here and hemodynamically stable.  Trial of: Meds ordered this encounter  Medications   dexamethasone  (DECADRON ) injection 10 mg   methocarbamol (ROBAXIN) 500 MG tablet    Sig: Take 2 tablets (1,000 mg total) by mouth 2 (two) times daily.    Dispense:  30 tablet    Refill:  0   Work/school excuse note: not needed. Medication sedation precautions given. Encourage ROM/movement as tolerated.  Recommend:  Follow-up Information     Schedule an appointment as soon as possible for a visit  with [facility].   Contact information: [address] [place]  [address] [phone]        Mahlon Comer BRAVO, MD.   Specialty: Family Medicine Why: As needed. Contact information: [address] [place] [address] [phone]                 Reviewed expectations re: course of current medical issues. Questions answered. Outlined signs and symptoms indicating need for more acute intervention. Patient verbalized understanding. After Visit Summary given.   SUBJECTIVE: History from: patient.  Benjamin Reed is a 38 y.o. male who presents with complaint of L thoracic back pain; x 3-4 weeks; denies injury/trauma; does lift heavy items at times. Occas tingling in L arm. Denies neck pain. OTC without much relief.   OBJECTIVE:  Vitals:   11/30/23 1019  BP: 128/81  Pulse: 67  Resp: 16  Temp: 97.7 F (36.5 C)  TempSrc: Oral  SpO2: 98%    General appearance: alert; no distress HEENT: ; AT Neck: supple with FROM; without midline tenderness Lungs: unlabored respirations; speaks full sentences without  difficulty Back: moderate TTP over L thoracic back musculature; FROM at waist; bruising: none; without midline tenderness Extremities: without edema; symmetrical without gross deformities; normal ROM of bilateral LE Skin: warm and dry Neurologic: normal gait; normal sensation and strength of LUE Psychological: alert and cooperative; normal mood and affect  Labs:  Labs Reviewed - No data to display  Imaging: DG Chest 2 View Result Date: 11/30/2023 CLINICAL DATA:  Left-sided chest and back pain. EXAM: CHEST - 2 VIEW COMPARISON:  04/30/2023 FINDINGS: The heart size and mediastinal contours are within normal limits. Both lungs are clear. No evidence of pneumothorax or pleural effusion. The visualized skeletal structures are unremarkable. IMPRESSION: Normal exam. Electronically Signed   By: Norleen DELENA Kil M.D.   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   Social History   Socioeconomic History   Marital status: Divorced    Spouse name: Not on file   Number of children: 2   Years of education: Not on file   Highest education level: Not on file  Occupational History   Not on file  Tobacco Use   Smoking status: Former    Current packs/day: 0.00    Average packs/day: 0.5 packs/day for 23.0 years (11.5 ttl  pk-yrs)    Types: Cigarettes    Start date: 01/30/1996    Quit date: 01/30/2019    Years since quitting: 4.8   Smokeless tobacco: Former    Types: Chew    Quit date: 2021  Vaping Use   Vaping status: Never Used  Substance and Sexual Activity   Alcohol use: Not Currently    Comment: weekly prior to c/o x today   Drug use: No   Sexual activity: Not Currently  Other Topics Concern   Not on file  Social History Narrative   Was in the army.    Social Drivers of Corporate Investment Banker Strain: Not on file  Food Insecurity: Not on file  Transportation Needs: Not on file  Physical Activity: Not on file  Stress: Not on file  Social Connections: Not on file  Intimate Partner Violence: Not on file   Family History  Problem Relation Age of Onset   Hyperlipidemia Mother        GPs   Hypertension Mother        GPs   Clotting disorder Mother    Heart disease Mother    Prostate cancer Paternal Grandfather    Heart disease Paternal Grandfather    Colon polyps Paternal Grandfather    Colon cancer Other        MGGM dx in her 24s to 64s   Colon polyps Other        GF dx in his late 16s, aunt   Barrett's esophagus Brother    Cleft palate Brother    Colon polyps Maternal Grandmother    Diabetes Maternal Grandmother    Hypertension Maternal Grandmother    Colon polyps Maternal Grandfather    Clotting disorder Maternal Grandfather    Heart disease Maternal Grandfather    Hypertension Maternal Grandfather    Clotting disorder Maternal Uncle    Hypertension Maternal Uncle    Heart disease Paternal Uncle        x 2   Colon polyps Maternal Aunt    Esophageal cancer Neg Hx    Rectal cancer Neg Hx    Stomach cancer Neg Hx    Past Surgical History:  Procedure Laterality Date   COLONOSCOPY     ESOPHAGEAL MANOMETRY N/A 08/31/2020   Procedure: ESOPHAGEAL MANOMETRY (EM);  Surgeon: San Sandor GAILS, DO;  Location: [facility];  Service: Gastroenterology;  Laterality: N/A;    HEMORRHOID SURGERY  04/12/2010   procedure for prolapse and hemorrhoids   KNEE ARTHROSCOPY Left 2013   KNEE ARTHROSCOPY Right 12/16/2012   Procedure: RIGHT ARTHROSCOPY KNEE;  Surgeon: Maude KANDICE Herald, MD;  Location: [facility];  Service: Orthopedics;  Laterality: Right;   UPPER GASTROINTESTINAL ENDOSCOPY        Rolinda Rogue, MD 11/30/23 [phone]

## 2024-04-30 ENCOUNTER — Encounter: Admitting: Family Medicine
